# Patient Record
Sex: Female | Born: 1970
Health system: Southern US, Community
[De-identification: ages and names within clinical notes are randomized; demographics above are authoritative.]

## PROBLEM LIST (undated history)

## (undated) DIAGNOSIS — I471 Supraventricular tachycardia, unspecified: Secondary | ICD-10-CM

## (undated) DIAGNOSIS — F419 Anxiety disorder, unspecified: Secondary | ICD-10-CM

## (undated) DIAGNOSIS — G479 Sleep disorder, unspecified: Secondary | ICD-10-CM

## (undated) DIAGNOSIS — E781 Pure hyperglyceridemia: Secondary | ICD-10-CM

## (undated) DIAGNOSIS — L409 Psoriasis, unspecified: Secondary | ICD-10-CM

## (undated) DIAGNOSIS — IMO0002 Reserved for concepts with insufficient information to code with codable children: Secondary | ICD-10-CM

## (undated) DIAGNOSIS — N939 Abnormal uterine and vaginal bleeding, unspecified: Secondary | ICD-10-CM

## (undated) HISTORY — DX: Supraventricular tachycardia, unspecified: I47.10

## (undated) HISTORY — DX: Reserved for concepts with insufficient information to code with codable children: IMO0002

## (undated) HISTORY — PX: CARDIAC ELECTROPHYSIOLOGY STUDY AND ABLATION: SHX1294

## (undated) HISTORY — DX: Supraventricular tachycardia: I47.1

## (undated) HISTORY — DX: Abnormal uterine and vaginal bleeding, unspecified: N93.9

## (undated) HISTORY — DX: Pure hyperglyceridemia: E78.1

## (undated) HISTORY — DX: Psoriasis, unspecified: L40.9

## (undated) HISTORY — DX: Anxiety disorder, unspecified: F41.9

## (undated) HISTORY — DX: Sleep disorder, unspecified: G47.9

---

## 1996-12-15 HISTORY — PX: BREAST ENHANCEMENT SURGERY: SHX7

## 2006-10-20 ENCOUNTER — Ambulatory Visit: Payer: Self-pay | Admitting: Family Medicine

## 2006-12-29 ENCOUNTER — Ambulatory Visit: Payer: Self-pay | Admitting: Family Medicine

## 2006-12-30 ENCOUNTER — Encounter: Payer: Self-pay | Admitting: Family Medicine

## 2008-01-10 ENCOUNTER — Ambulatory Visit: Payer: Self-pay | Admitting: Gynecology

## 2008-01-10 ENCOUNTER — Encounter (INDEPENDENT_AMBULATORY_CARE_PROVIDER_SITE_OTHER): Payer: Self-pay | Admitting: Gynecology

## 2009-01-29 ENCOUNTER — Ambulatory Visit: Payer: Self-pay | Admitting: Obstetrics & Gynecology

## 2009-01-29 ENCOUNTER — Encounter: Payer: Self-pay | Admitting: Obstetrics & Gynecology

## 2009-01-29 DIAGNOSIS — R87619 Unspecified abnormal cytological findings in specimens from cervix uteri: Secondary | ICD-10-CM

## 2009-01-29 DIAGNOSIS — IMO0002 Reserved for concepts with insufficient information to code with codable children: Secondary | ICD-10-CM

## 2009-01-29 HISTORY — DX: Reserved for concepts with insufficient information to code with codable children: IMO0002

## 2009-01-29 HISTORY — DX: Unspecified abnormal cytological findings in specimens from cervix uteri: R87.619

## 2009-03-06 ENCOUNTER — Ambulatory Visit: Payer: Self-pay | Admitting: Obstetrics & Gynecology

## 2009-03-06 HISTORY — PX: ENDOMETRIAL BIOPSY: SHX622

## 2009-08-28 ENCOUNTER — Ambulatory Visit: Payer: Self-pay | Admitting: Family Medicine

## 2009-08-28 ENCOUNTER — Encounter: Payer: Self-pay | Admitting: Family Medicine

## 2010-11-16 ENCOUNTER — Ambulatory Visit: Payer: Self-pay | Admitting: Internal Medicine

## 2010-12-04 ENCOUNTER — Ambulatory Visit: Payer: Self-pay | Admitting: Obstetrics & Gynecology

## 2011-04-29 NOTE — Assessment & Plan Note (Signed)
NAMEBRECK, Sarah Holmes                ACCOUNT NO.:  [id number]   MEDICAL RECORD NO.:  [id number]          PATIENT TYPE:  POB   LOCATION:  [facility] at [facility]         FACILITY:  [facility]   PHYSICIAN:  Scheryl Darter, MD       DATE OF BIRTH:  08-Sep-1971   DATE OF SERVICE:                                  CLINIC NOTE   The patient comes today for yearly exam.  The patient is a 40 year old  white female, gravida 0, last menstrual period began November 24, 2010.  She had been amenorrheic on Depo-Provera and she missed her injection in  November and after that she began having bleeding, which is just now  subsiding.  She wished to continue with Depo-Provera.   PAST MEDICAL HISTORY:  Psoriasis.   PAST SURGICAL HISTORY:  Breast augmentation in 1998.   MEDICATIONS:  Enbrel 50 mg subcu two times a week, Ambien 10 mg p.r.n.  sleep, Zantac OTC p.r.n., Tylenol p.r.n., ibuprofen p.r.n., and Depo-  Provera 150 mg every 3 months.   ALLERGIES:  No known drug allergies.   SOCIAL HISTORY:  The patient is married.  She is a smoker and has tried  to quit in the past.  Wellbutrin and Chantix and patches did not help.  No alcohol or illicit drug use.   FAMILY HISTORY:  Pancreatic and ovarian cancer.  She had a great aunt  with breast cancer.   REVIEW OF SYSTEMS:  Negative except for slight brownish spotting.  No  complaints of pain.  Her psoriasis has resolved on Enbrel.   PHYSICAL EXAMINATION:  GENERAL:  The patient is no acute distress.  Affect is normal.  VITAL SIGNS:  Weight is 143 pounds, which is down from her previous  visit.  Blood pressure 110/69, height 5 feet 5-1/2 inches, pulse 88.  CHEST:  Clear.  HEART:  Regular rhythm.  NECK:  No thyromegaly.  BREASTS:  Exhibit bilateral implants which are symmetric.  No masses or  tenderness or adenopathy.  ABDOMEN:  Flat, soft, nontender.  No mass.  EXTREMITIES:  No swelling or deformities.  PELVIC:  External genitalia, vagina, and cervix show some  slight  brownish bloody discharge.  Pap was done.  Uterus normal size.  No  adnexal mass or tenderness.   IMPRESSION:  1. Normal exam.  The patient wants to continue on Depo-Provera.  2. The patient needs to begin screening for breast disease.   PLAN:  We will order a screening mammogram.  She can continue with her  Depo-Provera 150 mg IM every 3 months.  I have asked her to consider  changing to Mirena.  She has history of abnormal Pap and she will be  notified of this Pap result.      Scheryl Darter, MD     JA/MEDQ  D:  12/04/2010  T:  12/04/2010  Job:  [id number]

## 2011-04-29 NOTE — Assessment & Plan Note (Signed)
Sarah Holmes, Sarah Holmes                ACCOUNT NO.:  [id number]   MEDICAL RECORD NO.:  [id number]          PATIENT TYPE:  POB   LOCATION:  [facility] at [facility]         FACILITY:  [facility]   PHYSICIAN:  Johnella Moloney, MD        DATE OF BIRTH:  1971-11-14   DATE OF SERVICE:                                  CLINIC NOTE   The patient is a 40 year old female multiparous female who presents for  her annual exam and Pap smear.  The patient denies any gynecologic  problems.  She is on Depo-Provera for contraception and also for history  of abnormal bleeding in the past.  She is sexually active but does not  report any concerns.  She has been amenorrheic since she has been on  Depo-Provera/   PAST MEDICAL HISTORY:  None.   PAST SURGICAL HISTORY:  Bilateral breast augmentation in 1998.   MEDICATIONS:  1. Ambien 10 mg q.h.s. as needed  2. Zantac as needed.  3. Ibuprofen and Tylenol as needed.  4. Depo-Provera 150 mg IM q. 3 months.   ALLERGIES:  No known drug allergies.   SOCIAL HISTORY:  The patient smokes a pack a day and has been smoking  for 8 years.  She has tried different smoking cessation methods  including Wellbutrin, Chantix and nicotine patches, but these have not  helped.  She denies any alcohol or addicting drug use.   FAMILY HISTORY:  Grandmother with pancreatic and ovarian cancer.  No  first-degree relatives have any gynecologic cancer or other concerns.   REVIEW OF SYSTEMS:  Comprehensive 14-point review of system was reviewed  with the patient and was entirely negative.   PHYSICAL EXAMINATION:  VITAL SIGNS:  Blood pressure is 128/87, pulse 89,  weight 161 pounds, height 5 feet 5-1/2 inches.  GENERAL:  In no apparent distress.  HEENT:  Normocephalic, atraumatic.  No thyroid enlargement.  BREASTS:  Bilateral augmentation without distortion, masses, tenderness  on nipple discharge.  No lymphadenopathy.  CHEST:  Clear to auscultation bilaterally.  HEART:  Regular rate and  rhythm.  ABDOMEN:  Soft, nontender and nondistended.  No abnormal masses or  organomegaly.  She does have an umbilical piercing.  PELVIC:  Normal external female genitalia.  Pink, well rugated vagina.  No lesions.  Multiparous cervix noted.  Pap smear obtained.  On bimanual  exam, her uterus is small, normal size, shape and contour without  tenderness.  Normal adnexa bilaterally.  EXTREMITIES:  No cyanosis,  clubbing or edema and nontender.   ASSESSMENT/PLAN:  The patient is a 40 year old here for her annual exam.  She had a normal breast examination, and a Pap smear was done today.  The patient was told to continue trying to see if she can sign up with a  smoking cessation program, given that her pharmacological methods did  not work.  She was told that a support group for smoking cessation could  be beneficial to her, and she says that she will follow up with these  programs that were offered at [facility].  She was also  told that smoking cessation programs were also offered at [facility].  She was told to call or come back in for evaluation of any further  gynecologic concerns.           ______________________________  Johnella Moloney, MD     UD/MEDQ  D:  01/29/2009  T:  01/29/2009  Job:  [id number]

## 2011-04-29 NOTE — Assessment & Plan Note (Signed)
NAMEGEARLENE, Sarah Holmes                ACCOUNT NO.:  [id number]   MEDICAL RECORD NO.:  [id number]          PATIENT TYPE:  POB   LOCATION:  [facility]         FACILITY:  [facility]   PHYSICIAN:  Tinnie Gens, MD        DATE OF BIRTH:  [birth year]   DATE OF SERVICE:                                  CLINIC NOTE   CHIEF COMPLAINT:  Repeat Pap.   HISTORY OF PRESENT ILLNESS:  The patient is a 41 year old nulliparas  female who comes in today for repeat Pap.  Her last Pap was in February  2010 and she is CIN 1.  She underwent colposcopy at that time, which  also revealed CIN 1.  She is here for followup Pap.  Today, she is  without complaints.  She continues on Depo-Provera 150 mg IM q.3 months.   PHYSICAL EXAMINATION:  VITAL SIGNS:  Her vitals are as in the chart.  GENERAL:  She is a well-developed, well-nourished female in no acute  stress.  ABDOMEN:  Soft, nontender, nondistended.  GU:  Normal external female genitalia.  BUS normal.  Vagina is pink and  rugated.  Cervix is parous without lesion.  Uterus is small, anteverted.  No adnexal mass or tenderness.   IMPRESSION:  Cervical intraepithelial neoplasia 1.   PLAN:  Repeat Pap today and follow CPE in 1 year.  If the Paps are  normal, continue with yearly Pap.           ______________________________  Tinnie Gens, MD     TP/MEDQ  D:  08/28/2009  T:  08/29/2009  Job:  [id number]

## 2011-05-02 NOTE — Group Therapy Note (Signed)
NAMESAROYA, RICCOBONO                ACCOUNT NO.:  [id number]   MEDICAL RECORD NO.:  [id number]          PATIENT TYPE:  POB   LOCATION:  [facility]                     FACILITY:   PHYSICIAN:  Tinnie Gens, MD        DATE OF BIRTH:  1971/11/25   DATE OF SERVICE:  11/28/2006                                  CLINIC NOTE   CHIEF COMPLAINT:  Yearly exam.   HISTORY OF PRESENT ILLNESS:  [name] is a 40 year old female  multiparous who presents for her annual exam and pap smear.  She states  she was recently married in September and that marriage is going well.  She uses Depo-Provera for birth control and control of history of  abnormal bleeding.  She also smokes one half a pack of cigarettes a day  and does not on stopping.  She states she works double shifts sometimes  at [employer] and has difficulty sleeping after that and would like a  refill on her Ambien.  She denies any other concerns.   ALLERGIES:  No known allergies.   MEDICATIONS:  Ambien 5 mg at bedtime as needed.  It is noted that she  had a prescription last for 30 with 2 refills and she did not call back  for more.   REVIEW OF SYSTEMS:  Patient denies any problem with the head, ears,  eyes, nose, or throat.  BREAST:  Denies masses, distortions or nipple  discharge.  It is noted that she has bilateral implants.  LUNGS:  Denies  coughing, wheezing, congestion.  HEART:  Denies palpation or chest pain.  ABDOMEN:  Denies tenderness, problems with bowels or bladder.  GYNECOLOGIC:  No problems.  Abnormal bleeding is one and she is on Depo.  She denies vaginal discharge or odor.   PHYSICAL EXAMINATION:  The patient is a well-nourished, well-developed  white female.  VITAL SIGNS:  Blood pressure 123/85, pulse 87, her weight today is 160,  height 55 1/2.  HEENT:  Normal cephalic, without thyroid enlargement.  BREAST:  Bilateral augmentation without distortion, masses, tenderness  or nipple discharge bilaterally.  CHEST:  Clear  to AMP.  HEART:  Rate and rhythm were regular without murmur, gallop or cardiac  enlargement.  ABDOMEN:  Soft and supple without masses or organomegaly.  She does have  her belly button pierced.  PELVIC:  Within normal limits for female.  VAGINA:  Clean and rugated.  CERVIX:  Multiparous and clean.  UTERUS:  Normal size, shape and contour without tenderness.  Adnexa  bilaterally clear.   IMPRESSION:  1. Normal GYN exam.  2. Sleep difficulties after long shifts.   PLAN:  1. The patient was given information on the Merina IUD as an option      for birth control and control of her abnormal bleeding.  2. Normal GYN exam.  3. Ambien 5 mg 1 p.o. at bedtime p.r.n. number 30 with 2 refills.  4. She is to return to the clinic for fasting labs to follow up on      elevated triglycerides one year ago.     ______________________________  Matt Holmes, N.P.    ______________________________  Tinnie Gens, MD    EMK/MEDQ  D:  12/29/2006  T:  12/30/2006  Job:  [id number]

## 2012-03-10 ENCOUNTER — Ambulatory Visit (INDEPENDENT_AMBULATORY_CARE_PROVIDER_SITE_OTHER): Payer: 59 | Admitting: Obstetrics & Gynecology

## 2012-03-10 ENCOUNTER — Encounter: Payer: Self-pay | Admitting: Obstetrics & Gynecology

## 2012-03-10 VITALS — BP 135/88 | HR 85 | Ht 65.0 in | Wt 163.0 lb

## 2012-03-10 DIAGNOSIS — F17200 Nicotine dependence, unspecified, uncomplicated: Secondary | ICD-10-CM

## 2012-03-10 DIAGNOSIS — Z3049 Encounter for surveillance of other contraceptives: Secondary | ICD-10-CM

## 2012-03-10 DIAGNOSIS — Z01419 Encounter for gynecological examination (general) (routine) without abnormal findings: Secondary | ICD-10-CM

## 2012-03-10 DIAGNOSIS — F17210 Nicotine dependence, cigarettes, uncomplicated: Secondary | ICD-10-CM | POA: Insufficient documentation

## 2012-03-10 DIAGNOSIS — Z1231 Encounter for screening mammogram for malignant neoplasm of breast: Secondary | ICD-10-CM

## 2012-03-10 DIAGNOSIS — Z1239 Encounter for other screening for malignant neoplasm of breast: Secondary | ICD-10-CM

## 2012-03-10 DIAGNOSIS — Z01812 Encounter for preprocedural laboratory examination: Secondary | ICD-10-CM

## 2012-03-10 DIAGNOSIS — Z124 Encounter for screening for malignant neoplasm of cervix: Secondary | ICD-10-CM

## 2012-03-10 DIAGNOSIS — Z3042 Encounter for surveillance of injectable contraceptive: Secondary | ICD-10-CM

## 2012-03-10 LAB — POCT URINE PREGNANCY: Preg Test, Ur: NEGATIVE

## 2012-03-10 MED ORDER — MEDROXYPROGESTERONE ACETATE 150 MG/ML IM SUSP: 150.0000 mg | Freq: Once | INTRAMUSCULAR | Status: DC

## 2012-03-10 MED ORDER — MEDROXYPROGESTERONE ACETATE 150 MG/ML IM SUSP
150.0000 mg | INTRAMUSCULAR | Status: DC
  Administered 2012-03-10: 150 mg via INTRAMUSCULAR

## 2012-03-10 NOTE — Progress Notes (Signed)
Addended by: Jaynie Collins A on: 03/10/2012 09:48 AM   Modules accepted: Orders

## 2012-03-10 NOTE — Progress Notes (Signed)
Addended by: Vinnie Langton C on: 03/10/2012 09:53 AM   Modules accepted: Orders

## 2012-03-10 NOTE — Progress Notes (Signed)
Addended by: Barbara Cower on: 03/10/2012 09:59 AM   Modules accepted: Orders

## 2012-03-10 NOTE — Progress Notes (Signed)
Addended by: Vinnie Langton C on: 03/10/2012 09:48 AM   Modules accepted: Orders

## 2012-03-10 NOTE — Patient Instructions (Signed)
Preventive Care for Adults, Female A healthy lifestyle and preventive care can promote health and wellness. Preventive health guidelines for women include the following key practices.  A routine yearly physical is a good way to check with your caregiver about your health and preventive screening. It is a chance to share any concerns and updates on your health, and to receive a thorough exam.   Visit your dentist for a routine exam and preventive care every 6 months. Brush your teeth twice a day and floss once a day. Good oral hygiene prevents tooth decay and gum disease.   The frequency of eye exams is based on your age, health, family medical history, use of contact lenses, and other factors. Follow your caregiver's recommendations for frequency of eye exams.   Eat a healthy diet. Foods like vegetables, fruits, whole grains, low-fat dairy products, and lean protein foods contain the nutrients you need without too many calories. Decrease your intake of foods high in solid fats, added sugars, and salt. Eat the right amount of calories for you.Get information about a proper diet from your caregiver, if necessary.   Regular physical exercise is one of the most important things you can do for your health. Most adults should get at least 150 minutes of moderate-intensity exercise (any activity that increases your heart rate and causes you to sweat) each week. In addition, most adults need muscle-strengthening exercises on 2 or more days a week.   Maintain a healthy weight. The body mass index (BMI) is a screening tool to identify possible weight problems. It provides an estimate of body fat based on height and weight. Your caregiver can help determine your BMI, and can help you achieve or maintain a healthy weight.For adults 20 years and older:   A BMI below 18.5 is considered underweight.   A BMI of 18.5 to 24.9 is normal.   A BMI of 25 to 29.9 is considered overweight.   A BMI of 30 and above is  considered obese.   Maintain normal blood lipids and cholesterol levels by exercising and minimizing your intake of saturated fat. Eat a balanced diet with plenty of fruit and vegetables. Blood tests for lipids and cholesterol should begin at age 20 and be repeated every 5 years. If your lipid or cholesterol levels are high, you are over 50, or you are at high risk for heart disease, you may need your cholesterol levels checked more frequently.Ongoing high lipid and cholesterol levels should be treated with medicines if diet and exercise are not effective.   If you smoke, find out from your caregiver how to quit. If you do not use tobacco, do not start.   If you are pregnant, do not drink alcohol. If you are breastfeeding, be very cautious about drinking alcohol. If you are not pregnant and choose to drink alcohol, do not exceed 1 drink per day. One drink is considered to be 12 ounces (355 mL) of beer, 5 ounces (148 mL) of wine, or 1.5 ounces (44 mL) of liquor.   Avoid use of street drugs. Do not share needles with anyone. Ask for help if you need support or instructions about stopping the use of drugs.   High blood pressure causes heart disease and increases the risk of stroke. Your blood pressure should be checked at least every 1 to 2 years. Ongoing high blood pressure should be treated with medicines if weight loss and exercise are not effective.   If you are 55 to 42   years old, ask your caregiver if you should take aspirin to prevent strokes.   Diabetes screening involves taking a blood sample to check your fasting blood sugar level. This should be done once every 3 years, after age 45, if you are within normal weight and without risk factors for diabetes. Testing should be considered at a younger age or be carried out more frequently if you are overweight and have at least 1 risk factor for diabetes.   Breast cancer screening is essential preventive care for women. You should practice "breast  self-awareness." This means understanding the normal appearance and feel of your breasts and may include breast self-examination. Any changes detected, no matter how small, should be reported to a caregiver. Women in their 20s and 30s should have a clinical breast exam (CBE) by a caregiver as part of a regular health exam every 1 to 3 years. After age 40, women should have a CBE every year. Starting at age 40, women should consider having a mammography (breast X-ray test) every year. Women who have a family history of breast cancer should talk to their caregiver about genetic screening. Women at a high risk of breast cancer should talk to their caregivers about having magnetic resonance imaging (MRI) and a mammography every year.   The Pap test is a screening test for cervical cancer. A Pap test can show cell changes on the cervix that might become cervical cancer if left untreated. A Pap test is a procedure in which cells are obtained and examined from the lower end of the uterus (cervix).   Women should have a Pap test starting at age 21.   Between ages 21 and 29, Pap tests should be repeated every 2 years.   Beginning at age 30, you should have a Pap test every 3 years as long as the past 3 Pap tests have been normal.   Some women have medical problems that increase the chance of getting cervical cancer. Talk to your caregiver about these problems. It is especially important to talk to your caregiver if a new problem develops soon after your last Pap test. In these cases, your caregiver may recommend more frequent screening and Pap tests.   The above recommendations are the same for women who have or have not gotten the vaccine for human papillomavirus (HPV).   If you had a hysterectomy for a problem that was not cancer or a condition that could lead to cancer, then you no longer need Pap tests. Even if you no longer need a Pap test, a regular exam is a good idea to make sure no other problems are  starting.   If you are between ages 65 and 70, and you have had normal Pap tests going back 10 years, you no longer need Pap tests. Even if you no longer need a Pap test, a regular exam is a good idea to make sure no other problems are starting.   If you have had past treatment for cervical cancer or a condition that could lead to cancer, you need Pap tests and screening for cancer for at least 20 years after your treatment.   If Pap tests have been discontinued, risk factors (such as a new sexual partner) need to be reassessed to determine if screening should be resumed.   The HPV test is an additional test that may be used for cervical cancer screening. The HPV test looks for the virus that can cause the cell changes on the cervix.   The cells collected during the Pap test can be tested for HPV. The HPV test could be used to screen women aged 30 years and older, and should be used in women of any age who have unclear Pap test results. After the age of 30, women should have HPV testing at the same frequency as a Pap test.   Colorectal cancer can be detected and often prevented. Most routine colorectal cancer screening begins at the age of 50 and continues through age 75. However, your caregiver may recommend screening at an earlier age if you have risk factors for colon cancer. On a yearly basis, your caregiver may provide home test kits to check for hidden blood in the stool. Use of a small camera at the end of a tube, to directly examine the colon (sigmoidoscopy or colonoscopy), can detect the earliest forms of colorectal cancer. Talk to your caregiver about this at age 50, when routine screening begins. Direct examination of the colon should be repeated every 5 to 10 years through age 75, unless early forms of pre-cancerous polyps or small growths are found.   Hepatitis C blood testing is recommended for all people born from 1945 through 1965 and any individual with known risks for hepatitis C.    Practice safe sex. Use condoms and avoid high-risk sexual practices to reduce the spread of sexually transmitted infections (STIs). STIs include gonorrhea, chlamydia, syphilis, trichomonas, herpes, HPV, and human immunodeficiency virus (HIV). Herpes, HIV, and HPV are viral illnesses that have no cure. They can result in disability, cancer, and death. Sexually active women aged 25 and younger should be checked for chlamydia. Older women with new or multiple partners should also be tested for chlamydia. Testing for other STIs is recommended if you are sexually active and at increased risk.   Osteoporosis is a disease in which the bones lose minerals and strength with aging. This can result in serious bone fractures. The risk of osteoporosis can be identified using a bone density scan. Women ages 65 and over and women at risk for fractures or osteoporosis should discuss screening with their caregivers. Ask your caregiver whether you should take a calcium supplement or vitamin D to reduce the rate of osteoporosis.   Menopause can be associated with physical symptoms and risks. Hormone replacement therapy is available to decrease symptoms and risks. You should talk to your caregiver about whether hormone replacement therapy is right for you.   Use sunscreen with sun protection factor (SPF) of 30 or more. Apply sunscreen liberally and repeatedly throughout the day. You should seek shade when your shadow is shorter than you. Protect yourself by wearing long sleeves, pants, a wide-brimmed hat, and sunglasses year round, whenever you are outdoors.   Once a month, do a whole body skin exam, using a mirror to look at the skin on your back. Notify your caregiver of new moles, moles that have irregular borders, moles that are larger than a pencil eraser, or moles that have changed in shape or color.   Stay current with required immunizations.   Influenza. You need a dose every fall (or winter). The composition of  the flu vaccine changes each year, so being vaccinated once is not enough.   Pneumococcal polysaccharide. You need 1 to 2 doses if you smoke cigarettes or if you have certain chronic medical conditions. You need 1 dose at age 65 (or older) if you have never been vaccinated.   Tetanus, diphtheria, pertussis (Tdap, Td). Get 1 dose of   Tdap vaccine if you are younger than age 65, are over 65 and have contact with an infant, are a healthcare worker, are pregnant, or simply want to be protected from whooping cough. After that, you need a Td booster dose every 10 years. Consult your caregiver if you have not had at least 3 tetanus and diphtheria-containing shots sometime in your life or have a deep or dirty wound.   HPV. You need this vaccine if you are a woman age 26 or younger. The vaccine is given in 3 doses over 6 months.   Measles, mumps, rubella (MMR). You need at least 1 dose of MMR if you were born in 1957 or later. You may also need a second dose.   Meningococcal. If you are age 19 to 21 and a first-year college student living in a residence hall, or have one of several medical conditions, you need to get vaccinated against meningococcal disease. You may also need additional booster doses.   Zoster (shingles). If you are age 60 or older, you should get this vaccine.   Varicella (chickenpox). If you have never had chickenpox or you were vaccinated but received only 1 dose, talk to your caregiver to find out if you need this vaccine.   Hepatitis A. You need this vaccine if you have a specific risk factor for hepatitis A virus infection or you simply wish to be protected from this disease. The vaccine is usually given as 2 doses, 6 to 18 months apart.   Hepatitis B. You need this vaccine if you have a specific risk factor for hepatitis B virus infection or you simply wish to be protected from this disease. The vaccine is given in 3 doses, usually over 6 months.  Preventive Services /  Frequency Ages 19 to 39  Blood pressure check.** / Every 1 to 2 years.   Lipid and cholesterol check.** / Every 5 years beginning at age 20.   Clinical breast exam.** / Every 3 years for women in their 20s and 30s.   Pap test.** / Every 2 years from ages 21 through 29. Every 3 years starting at age 30 through age 65 or 70 with a history of 3 consecutive normal Pap tests.   HPV screening.** / Every 3 years from ages 30 through ages 65 to 70 with a history of 3 consecutive normal Pap tests.   Hepatitis C blood test.** / For any individual with known risks for hepatitis C.   Skin self-exam. / Monthly.   Influenza immunization.** / Every year.   Pneumococcal polysaccharide immunization.** / 1 to 2 doses if you smoke cigarettes or if you have certain chronic medical conditions.   Tetanus, diphtheria, pertussis (Tdap, Td) immunization. / A one-time dose of Tdap vaccine. After that, you need a Td booster dose every 10 years.   HPV immunization. / 3 doses over 6 months, if you are 26 and younger.   Measles, mumps, rubella (MMR) immunization. / You need at least 1 dose of MMR if you were born in 1957 or later. You may also need a second dose.   Meningococcal immunization. / 1 dose if you are age 19 to 21 and a first-year college student living in a residence hall, or have one of several medical conditions, you need to get vaccinated against meningococcal disease. You may also need additional booster doses.   Varicella immunization.** / Consult your caregiver.   Hepatitis A immunization.** / Consult your caregiver. 2 doses, 6 to 18 months   apart.   Hepatitis B immunization.** / Consult your caregiver. 3 doses usually over 6 months.  Ages 40 to 64  Blood pressure check.** / Every 1 to 2 years.   Lipid and cholesterol check.** / Every 5 years beginning at age 20.   Clinical breast exam.** / Every year after age 40.   Mammogram.** / Every year beginning at age 40 and continuing for as  long as you are in good health. Consult with your caregiver.   Pap test.** / Every 3 years starting at age 30 through age 65 or 70 with a history of 3 consecutive normal Pap tests.   HPV screening.** / Every 3 years from ages 30 through ages 65 to 70 with a history of 3 consecutive normal Pap tests.   Fecal occult blood test (FOBT) of stool. / Every year beginning at age 50 and continuing until age 75. You may not need to do this test if you get a colonoscopy every 10 years.   Flexible sigmoidoscopy or colonoscopy.** / Every 5 years for a flexible sigmoidoscopy or every 10 years for a colonoscopy beginning at age 50 and continuing until age 75.   Hepatitis C blood test.** / For all people born from 1945 through 1965 and any individual with known risks for hepatitis C.   Skin self-exam. / Monthly.   Influenza immunization.** / Every year.   Pneumococcal polysaccharide immunization.** / 1 to 2 doses if you smoke cigarettes or if you have certain chronic medical conditions.   Tetanus, diphtheria, pertussis (Tdap, Td) immunization.** / A one-time dose of Tdap vaccine. After that, you need a Td booster dose every 10 years.   Measles, mumps, rubella (MMR) immunization. / You need at least 1 dose of MMR if you were born in 1957 or later. You may also need a second dose.   Varicella immunization.** / Consult your caregiver.   Meningococcal immunization.** / Consult your caregiver.   Hepatitis A immunization.** / Consult your caregiver. 2 doses, 6 to 18 months apart.   Hepatitis B immunization.** / Consult your caregiver. 3 doses, usually over 6 months.  Ages 65 and over  Blood pressure check.** / Every 1 to 2 years.   Lipid and cholesterol check.** / Every 5 years beginning at age 20.   Clinical breast exam.** / Every year after age 40.   Mammogram.** / Every year beginning at age 40 and continuing for as long as you are in good health. Consult with your caregiver.   Pap test.** /  Every 3 years starting at age 30 through age 65 or 70 with a 3 consecutive normal Pap tests. Testing can be stopped between 65 and 70 with 3 consecutive normal Pap tests and no abnormal Pap or HPV tests in the past 10 years.   HPV screening.** / Every 3 years from ages 30 through ages 65 or 70 with a history of 3 consecutive normal Pap tests. Testing can be stopped between 65 and 70 with 3 consecutive normal Pap tests and no abnormal Pap or HPV tests in the past 10 years.   Fecal occult blood test (FOBT) of stool. / Every year beginning at age 50 and continuing until age 75. You may not need to do this test if you get a colonoscopy every 10 years.   Flexible sigmoidoscopy or colonoscopy.** / Every 5 years for a flexible sigmoidoscopy or every 10 years for a colonoscopy beginning at age 50 and continuing until age 75.   Hepatitis   C blood test.** / For all people born from 1945 through 1965 and any individual with known risks for hepatitis C.   Osteoporosis screening.** / A one-time screening for women ages 65 and over and women at risk for fractures or osteoporosis.   Skin self-exam. / Monthly.   Influenza immunization.** / Every year.   Pneumococcal polysaccharide immunization.** / 1 dose at age 65 (or older) if you have never been vaccinated.   Tetanus, diphtheria, pertussis (Tdap, Td) immunization. / A one-time dose of Tdap vaccine if you are over 65 and have contact with an infant, are a healthcare worker, or simply want to be protected from whooping cough. After that, you need a Td booster dose every 10 years.   Varicella immunization.** / Consult your caregiver.   Meningococcal immunization.** / Consult your caregiver.   Hepatitis A immunization.** / Consult your caregiver. 2 doses, 6 to 18 months apart.   Hepatitis B immunization.** / Check with your caregiver. 3 doses, usually over 6 months.  ** Family history and personal history of risk and conditions may change your caregiver's  recommendations. Document Released: 01/27/2002 Document Revised: 11/20/2011 Document Reviewed: 04/28/2011 [employer] Patient Information 2012 [employer]. 

## 2012-03-10 NOTE — Progress Notes (Addendum)
  Subjective:     Sarah Holmes is a 41 y.o. G0 female and is here for a comprehensive physical exam. The patient reports no problems. Trying to quit smoking.  History   Social History  . Marital Status: Married    Spouse Name: N/A    Number of Children: N/A  . Years of Education: N/A   Occupational History  . Not on file.   Social History Main Topics  . Smoking status: Current Everyday Smoker -- 0.5 packs/day  . Smokeless tobacco: Not on file  . Alcohol Use: Yes     rare  . Drug Use: No  . Sexually Active: Yes -- Female partner(s)    Birth Control/ Protection: None   Other Topics Concern  . Not on file   Social History Narrative  . No narrative on file   Health Maintenance  Topic Date Due  . Pap Smear  11/06/1988  . Tetanus/tdap  11/06/1989  . Influenza Vaccine  09/15/2011    The following portions of the patient's history were reviewed and updated as appropriate: allergies, current medications, past family history, past medical history, past social history, past surgical history and problem list.  Review of Systems A comprehensive review of systems was negative.   Objective:    Blood pressure 135/88, pulse 85, height 5\' 5"  (1.651 m), weight 163 lb (73.936 kg), last menstrual period 02/17/2012. GENERAL: Well-developed, well-nourished female in no acute distress.  HEENT: Normocephalic, atraumatic. Sclerae anicteric.  NECK: Supple. Normal thyroid.  LUNGS: Clear to auscultation bilaterally.  HEART: Regular rate and rhythm. BREASTS: Symmetric with everted nipples. Implants in place bilaterally. No abnormal masses, skin changes, nipple drainage, or lymphadenopathy. ABDOMEN: Soft, nontender, nondistended. No organomegaly. PELVIC: Normal external female genitalia. Vagina is pink and rugated.  Normal discharge. Normal nulliparous cervix contour. Pap smear obtained. Uterus is normal in size. No adnexal mass or tenderness.  EXTREMITIES: No cyanosis, clubbing, or edema, 2+  distal pulses.   Assessment:    Healthy female exam.    Plan:   Follow up pap smear Mammogram to be scheduled Patient to get preventative health maintenance labs at [facility] Continue Depo Provera for [facility]; ui Routine GYN care

## 2012-03-30 ENCOUNTER — Ambulatory Visit: Payer: 59

## 2012-04-13 ENCOUNTER — Ambulatory Visit
Admission: RE | Admit: 2012-04-13 | Discharge: 2012-04-13 | Disposition: A | Discharge status: Home / Self Care | Payer: 59 | Source: Ambulatory Visit | Attending: Obstetrics & Gynecology | Admitting: Obstetrics & Gynecology

## 2013-04-19 ENCOUNTER — Other Ambulatory Visit: Payer: Self-pay | Admitting: Obstetrics & Gynecology

## 2014-02-08 ENCOUNTER — Emergency Department: Payer: Self-pay | Admitting: Emergency Medicine

## 2014-02-08 LAB — BASIC METABOLIC PANEL
ANION GAP: 9 (ref 7–16)
BUN: 2 mg/dL — ABNORMAL LOW (ref 7–18)
CO2: 24 mmol/L (ref 21–32)
Calcium, Total: 8.7 mg/dL (ref 8.5–10.1)
Chloride: 104 mmol/L (ref 98–107)
Creatinine: 0.63 mg/dL (ref 0.60–1.30)
GLUCOSE: 143 mg/dL — AB (ref 65–99)
OSMOLALITY: 272 (ref 275–301)
Potassium: 2.8 mmol/L — ABNORMAL LOW (ref 3.5–5.1)
SODIUM: 137 mmol/L (ref 136–145)

## 2014-02-08 LAB — CBC
HCT: 45.3 % (ref 35.0–47.0)
HGB: 15.7 g/dL (ref 12.0–16.0)
MCH: 39.5 pg — AB (ref 26.0–34.0)
MCHC: 34.6 g/dL (ref 32.0–36.0)
MCV: 114 fL — AB (ref 80–100)
Platelet: 295 10*3/uL (ref 150–440)
RBC: 3.97 10*6/uL (ref 3.80–5.20)
RDW: 16 % — ABNORMAL HIGH (ref 11.5–14.5)
WBC: 15 10*3/uL — ABNORMAL HIGH (ref 3.6–11.0)

## 2014-02-08 LAB — TROPONIN I: Troponin-I: <0.02 ng/mL

## 2014-05-08 ENCOUNTER — Emergency Department: Payer: Self-pay | Admitting: Emergency Medicine

## 2014-05-08 LAB — CBC
HCT: 44.5 % (ref 35.0–47.0)
HGB: 15.5 g/dL (ref 12.0–16.0)
MCH: 40 pg — AB (ref 26.0–34.0)
MCHC: 34.8 g/dL (ref 32.0–36.0)
MCV: 115 fL — AB (ref 80–100)
Platelet: 288 10*3/uL (ref 150–440)
RBC: 3.88 10*6/uL (ref 3.80–5.20)
RDW: 15.3 % — ABNORMAL HIGH (ref 11.5–14.5)
WBC: 15 10*3/uL — ABNORMAL HIGH (ref 3.6–11.0)

## 2014-05-08 LAB — URINALYSIS, COMPLETE
BLOOD: NEGATIVE
Bacteria: NONE SEEN
Bilirubin,UR: NEGATIVE
GLUCOSE, UR: NEGATIVE mg/dL (ref 0–75)
KETONE: NEGATIVE
Leukocyte Esterase: NEGATIVE
Nitrite: NEGATIVE
Ph: 7 (ref 4.5–8.0)
Protein: NEGATIVE
Specific Gravity: 1.004 (ref 1.003–1.030)
Squamous Epithelial: 1

## 2014-05-08 LAB — COMPREHENSIVE METABOLIC PANEL
ALK PHOS: 147 U/L — AB
ANION GAP: 10 (ref 7–16)
AST: 16 U/L (ref 15–37)
Albumin: 4 g/dL (ref 3.4–5.0)
BUN: 6 mg/dL — AB (ref 7–18)
Bilirubin,Total: 0.7 mg/dL (ref 0.2–1.0)
CO2: 26 mmol/L (ref 21–32)
CREATININE: 0.54 mg/dL — AB (ref 0.60–1.30)
Calcium, Total: 8.9 mg/dL (ref 8.5–10.1)
Chloride: 105 mmol/L (ref 98–107)
EGFR (Non-African Amer.): >60
GLUCOSE: 105 mg/dL — AB (ref 65–99)
Osmolality: 279 (ref 275–301)
Potassium: 2.5 mmol/L — CL (ref 3.5–5.1)
SGPT (ALT): 16 U/L (ref 12–78)
Sodium: 141 mmol/L (ref 136–145)
TOTAL PROTEIN: 7.7 g/dL (ref 6.4–8.2)

## 2014-05-08 LAB — TROPONIN I: Troponin-I: <0.02 ng/mL

## 2014-05-08 LAB — T4, FREE: Free Thyroxine: 1.1 ng/dL (ref 0.76–1.46)

## 2014-05-08 LAB — MAGNESIUM: Magnesium: 1.7 mg/dL — ABNORMAL LOW

## 2014-05-08 LAB — TSH: Thyroid Stimulating Horm: 0.44 u[IU]/mL — ABNORMAL LOW

## 2015-09-03 DIAGNOSIS — F411 Generalized anxiety disorder: Secondary | ICD-10-CM | POA: Insufficient documentation

## 2015-09-17 ENCOUNTER — Encounter: Payer: Self-pay | Admitting: Internal Medicine

## 2015-09-17 ENCOUNTER — Ambulatory Visit (INDEPENDENT_AMBULATORY_CARE_PROVIDER_SITE_OTHER): Payer: BLUE CROSS/BLUE SHIELD | Admitting: Internal Medicine

## 2015-09-17 VITALS — BP 122/86 | HR 78 | Ht 65.5 in | Wt 163.6 lb

## 2015-09-17 DIAGNOSIS — I471 Supraventricular tachycardia: Secondary | ICD-10-CM | POA: Diagnosis not present

## 2015-09-17 DIAGNOSIS — Z79899 Other long term (current) drug therapy: Secondary | ICD-10-CM | POA: Diagnosis not present

## 2015-09-17 DIAGNOSIS — R55 Syncope and collapse: Secondary | ICD-10-CM

## 2015-09-17 LAB — BASIC METABOLIC PANEL
BUN: 10 mg/dL (ref 7–25)
CHLORIDE: 105 mmol/L (ref 98–110)
CO2: 22 mmol/L (ref 20–31)
Calcium: 9.4 mg/dL (ref 8.6–10.2)
Creat: 0.66 mg/dL (ref 0.50–1.10)
GLUCOSE: 96 mg/dL (ref 65–99)
POTASSIUM: 3.5 mmol/L (ref 3.5–5.3)
Sodium: 138 mmol/L (ref 135–146)

## 2015-09-17 LAB — MAGNESIUM: Magnesium: 1.8 mg/dL (ref 1.5–2.5)

## 2015-09-17 MED ORDER — DILTIAZEM HCL ER COATED BEADS 120 MG PO CP24: 120.0000 mg | ORAL_CAPSULE | Freq: Every day | ORAL | Status: DC

## 2015-09-17 NOTE — Progress Notes (Signed)
ELECTROPHYSIOLOGY CONSULT NOTE  Patient ID: Sarah Holmes, MRN: [id number], DOB/AGE: 1971/03/21 44 y.o. Admit date: (Not on file) Date of Consult: 09/17/2015  Primary Physician: Kirk Ruths., MD Primary Cardiologist: new  Chief Complaint: SVT      HPI Sarah Holmes is a 44 y.o. female  Seen for SVT. This occurred initially about 20 half years ago. Her heart rate was apparently documented at 2 30 bpm. She received adenosine at the [facility] ER. She was treated with metoprolol. She had recurrences prompting up titration which she tolerated poorly. He is well complicated by dizziness. She subsequently is up on digoxin. Notably she has a history of significant hypokalemia and has been on potassium repletion in the past.  Her episodes of tachycardia are frog negative. They're associated with lightheadedness as noted and some shortness of breath.  Apart from the above she has had no problems with exercise intolerance.  Cardiac evaluation was reported to include an echo that was apparently normal.      Past Medical History  Diagnosis Date  . Psoriasis   . Abnormal Pap smear 08/28/09    lgsil  . Abnormal Pap smear 01/29/09    lgsil  . Abnormal uterine bleeding     CONTROLLED W/DEPO PROVERA  . Sleep difficulties   . High triglycerides   . SVT (supraventricular tachycardia) ([place])   . Anxiety       Surgical History:  Past Surgical History  Procedure Laterality Date  . Breast enhancement surgery  1998    Dr. Wendy Poet  . Endometrial biopsy  03/06/09    low grade squamous CIN1     Home Meds: Prior to Admission medications   Medication Sig Start Date End Date Taking? Authorizing Provider  cyanocobalamin (,VITAMIN B-12,) 1000 MCG/ML injection Inject 1 mcg into the muscle every 30 (thirty) days. 09/03/15  Yes Historical Provider, MD  digoxin (LANOXIN) 0.125 MG tablet Take 1 tablet by mouth daily. 09/03/15 09/02/16 Yes Historical Provider, MD  DULoxetine (CYMBALTA) 60 MG  capsule Take 60 mg by mouth daily. 04/09/15  Yes Historical Provider, MD  medroxyPROGESTERone (DEPO-PROVERA) 150 MG/ML injection Inject 150 mg into the muscle every 3 (three) months.   Yes Historical Provider, MD  metoprolol tartrate (LOPRESSOR) 25 MG tablet Take 25 mg by mouth daily.   Yes Historical Provider, MD  ranitidine (ZANTAC) 150 MG tablet Take 150 mg by mouth at bedtime.   Yes Historical Provider, MD  topiramate (TOPAMAX) 25 MG tablet Take 1 tablet by mouth 2 (two) times daily. 09/03/15 09/02/16 Yes Historical Provider, MD    Inpatient Medications:  . medroxyPROGESTERone  150 mg Intramuscular Q90 days    Allergies: No Known Allergies  Social History   Social History  . Marital Status: Married    Spouse Name: N/A  . Number of Children: N/A  . Years of Education: N/A   Occupational History  . Not on file.   Social History Main Topics  . Smoking status: Current Every Day Smoker -- 0.50 packs/day  . Smokeless tobacco: Not on file  . Alcohol Use: Yes     Comment: rare  . Drug Use: No  . Sexual Activity:    Partners: Male    Birth Control/ Protection: None   Other Topics Concern  . Not on file   Social History Narrative     Family History  Problem Relation Age of Onset  . Diabetes Mother   . Hypertension Mother   . Cancer Father 36  LUNG   . CVA Father   . Cancer Maternal Grandmother     PANCREATIC  . Cancer Maternal Grandfather     LUNG  . Ovarian cancer Maternal Grandfather   . Diabetes Paternal Grandmother      ROS:  Please see the history of present illness.     All other systems reviewed and negative.    Physical Exam:   Blood pressure 122/86, pulse 78, height 5' 5.5" (1.664 m), weight 163 lb 9.6 oz (74.208 kg). General: Well developed, well nourished female in no acute distress. Head: Normocephalic, atraumatic, sclera non-icteric, no xanthomas, nares are without discharge. EENT: normal Lymph Nodes:  none Back: without scoliosis/kyphosis*  no  CVA tendersness Neck: Negative for carotid bruits. JVD not elevated. Lungs: Clear bilaterally to auscultation without wheezes, rales, or rhonchi. Breathing is unlabored. Heart: RRR with S1 S2. N  murmur , rubs, or gallops appreciated. Abdomen: Soft, non-tender, non-distended with normoactive bowel sounds. No hepatomegaly. No rebound/guarding. No obvious abdominal masses. Msk:  Strength and tone appear normal for age. Extremities: No clubbing or cyanosis. No  edema.  Distal pedal pulses are 2+ and equal bilaterally. Skin: Warm and Dry Neuro: Alert and oriented X 3. CN III-XII intact Grossly normal sensory and motor function . Psych:  Responds to questions appropriately with a normal affect.      Labs: Cardiac Enzymes No results for input(s): CKTOTAL, CKMB, TROPONINI in the last 72 hours. CBC Lab Results  Component Value Date   WBC 15.0* 05/08/2014   HGB 15.5 05/08/2014   HCT 44.5 05/08/2014   MCV 115* 05/08/2014   PLT 288 05/08/2014   PROTIME: No results for input(s): LABPROT, INR in the last 72 hours. Chemistry No results for input(s): NA, K, CL, CO2, BUN, CREATININE, CALCIUM, PROT, BILITOT, ALKPHOS, ALT, AST, GLUCOSE in the last 168 hours.  Invalid input(s): LABALBU Lipids No results found for: CHOL, HDL, LDLCALC, TRIG BNP No results found for: PROBNP Thyroid Function Tests: No results for input(s): TSH, T4TOTAL, T3FREE, THYROIDAB in the last 72 hours.  Invalid input(s): FREET3    Miscellaneous No results found for: DDIMER  Radiology/Studies:  No results found.  EKG NSR 16/08/39  Assessment and Plan:  SVT  Presyncope  Hypokalemia  ? Anxiety disorder   The patient has documented edematous and responsive SVT. This was about 2-1/2 years ago. She has had repeated episodes of tachypalpitations associated with lightheadedness at their initiation. These are probably the same mechanism but given the association with stress, I will undertake an event recorder to make  sure that she doesn't have another mechanism of tachycardia palpitations which are likely to persist following catheter ablation.  We have discussed catheter ablation procedure will [place] and risks. We will tentatively schedule her to see Dr. Lovena Le in about 4 weeks.  She has a history of hypokalemia. All the blood tests that I can find in epic both here and and at [facility] R less than 3.5 and 2 of them are less than 3.0. This will need to be addressed with supplementation and then an evaluation as to the cause of potassium wasting      Virl Axe

## 2015-09-17 NOTE — Patient Instructions (Addendum)
Medication Instructions:  Your physician has recommended you make the following change in your medication: 1) STOP Metoprolol 2) START Cardizem 120 mg daily  Labwork: Today: BMET,  Magnesium, & Digoxin level  Testing/Procedures: Your physician has recommended that you wear an event monitor. Event monitors are medical devices that record the heart's electrical activity. Doctors most often [place] these monitors to diagnose arrhythmias. Arrhythmias are problems with the speed or rhythm of the heartbeat. The monitor is a small, portable device. You can wear one while you do your normal daily activities. This is usually used to diagnose what is causing palpitations/syncope (passing out).  Follow-Up: Your physician recommends that you schedule a follow-up appointment in: 4 weeks with Dr. Lovena Le to discuss SVT ablation.   Any Other Special Instructions Will Be Listed Below (If Applicable). Thank you for choosing [facility]!!

## 2015-09-18 ENCOUNTER — Ambulatory Visit (INDEPENDENT_AMBULATORY_CARE_PROVIDER_SITE_OTHER): Payer: BLUE CROSS/BLUE SHIELD

## 2015-09-18 DIAGNOSIS — Z79899 Other long term (current) drug therapy: Secondary | ICD-10-CM

## 2015-09-18 DIAGNOSIS — I471 Supraventricular tachycardia: Secondary | ICD-10-CM

## 2015-09-18 DIAGNOSIS — R55 Syncope and collapse: Secondary | ICD-10-CM | POA: Diagnosis not present

## 2015-09-18 LAB — DIGOXIN LEVEL: Digoxin Level: 0.6 ug/L — ABNORMAL LOW (ref 0.8–2.0)

## 2015-09-20 ENCOUNTER — Telehealth: Payer: Self-pay | Admitting: Internal Medicine

## 2015-09-20 NOTE — Telephone Encounter (Signed)
F/u        Pt calling back for lab results.

## 2015-09-20 NOTE — Telephone Encounter (Signed)
Informed the pt that per Dr Caryl Comes her labs were normal.  Pt verbalized understanding.

## 2015-09-26 ENCOUNTER — Telehealth: Payer: Self-pay

## 2015-09-26 NOTE — Telephone Encounter (Signed)
Discussed with Dr Lovena Le and he advised that the pt needs to continue current medications (beta blocker was stopped due to swelling) and offered to have the pt's appointment moved up.  There are consult slots available on 10/10/15.  I left the pt a message to call the office if she would like an earlier appointment.

## 2015-09-26 NOTE — Telephone Encounter (Signed)
Follow up:  Pt is returning your call   Thanks

## 2015-09-26 NOTE — Telephone Encounter (Signed)
[employer] called and the pt went into SVT with a rate of 202 for 47 seconds at 9:56 central time.  The pt complained of chest pressure, fluttering in chest.  [employer] did attempt to reach the pt but could not contact the pt. I attempted to reach the pt and left a message on her voicemail to contact our office. Will have strip pulled.

## 2015-09-26 NOTE — Telephone Encounter (Signed)
I spoke with the pt and she states that her heart rate has slowed down and at this time she feels tired.  I will review strips with Dr Lovena Le and contact the pt with any other recommendations.

## 2015-09-26 NOTE — Telephone Encounter (Signed)
The pt moved up appointment to 10/10/15.

## 2015-10-10 ENCOUNTER — Ambulatory Visit (INDEPENDENT_AMBULATORY_CARE_PROVIDER_SITE_OTHER): Payer: BLUE CROSS/BLUE SHIELD | Admitting: Internal Medicine

## 2015-10-10 ENCOUNTER — Encounter: Payer: Self-pay | Admitting: Internal Medicine

## 2015-10-10 VITALS — BP 124/76 | HR 86 | Ht 65.5 in | Wt 164.0 lb

## 2015-10-10 DIAGNOSIS — I471 Supraventricular tachycardia, unspecified: Secondary | ICD-10-CM

## 2015-10-10 DIAGNOSIS — L409 Psoriasis, unspecified: Secondary | ICD-10-CM | POA: Insufficient documentation

## 2015-10-10 DIAGNOSIS — F411 Generalized anxiety disorder: Secondary | ICD-10-CM

## 2015-10-10 DIAGNOSIS — Z720 Tobacco use: Secondary | ICD-10-CM | POA: Diagnosis not present

## 2015-10-10 NOTE — Progress Notes (Signed)
HPI Sarah Holmes is referred today by Dr. Caryl Comes for evaluation of SVT. She is a pleasant 44 yo woman with a h/o palpitations dating back several years. She has had recurrent documented SVT and required treatment with IV adenosine. She has been on metoprolol for a couple of years and required uptitration of her metoprolol resulting in fatigue. When she experiences her SVT she will have sob, chest pressure, and near syncope. The longest episode lasted for over a hour in duration. After switching to cardizem and digoxin, she has had break through episodes of SVT, up to 190/min. No Known Allergies   Current Outpatient Prescriptions  Medication Sig Dispense Refill  . cyanocobalamin (,VITAMIN B-12,) 1000 MCG/ML injection Inject 1 mcg into the muscle every 30 (thirty) days.    . digoxin (LANOXIN) 0.125 MG tablet Take 1 tablet by mouth daily.    Marland Kitchen diltiazem (CARDIZEM CD) 120 MG 24 hr capsule Take 1 capsule (120 mg total) by mouth daily. 30 capsule 3  . DULoxetine (CYMBALTA) 60 MG capsule Take 60 mg by mouth daily.    . medroxyPROGESTERone (DEPO-PROVERA) 150 MG/ML injection Inject 150 mg into the muscle every 3 (three) months.    . ranitidine (ZANTAC) 150 MG tablet Take 150 mg by mouth at bedtime.    . topiramate (TOPAMAX) 25 MG tablet Take 1 tablet by mouth 2 (two) times daily.     No current facility-administered medications for this visit.     Past Medical History  Diagnosis Date  . Psoriasis   . Abnormal Pap smear 08/28/09    lgsil  . Abnormal Pap smear 01/29/09    lgsil  . Abnormal uterine bleeding     CONTROLLED W/DEPO PROVERA  . Sleep difficulties   . High triglycerides   . SVT (supraventricular tachycardia) ([place])   . Anxiety     ROS:   All systems reviewed and negative except as noted in the HPI.   Past Surgical History  Procedure Laterality Date  . Breast enhancement surgery  1998    Dr. Wendy Poet  . Endometrial biopsy  03/06/09    low grade squamous CIN1     Family  History  Problem Relation Age of Onset  . Diabetes Mother   . Hypertension Mother   . Cancer Father 63    LUNG   . CVA Father   . Cancer Maternal Grandmother     PANCREATIC  . Cancer Maternal Grandfather     LUNG  . Ovarian cancer Maternal Grandfather   . Diabetes Paternal Grandmother      Social History   Social History  . Marital Status: Married    Spouse Name: N/A  . Number of Children: N/A  . Years of Education: N/A   Occupational History  . Not on file.   Social History Main Topics  . Smoking status: Current Every Day Smoker -- 0.50 packs/day  . Smokeless tobacco: Not on file  . Alcohol Use: Yes     Comment: rare  . Drug Use: No  . Sexual Activity:    Partners: Male    Birth Control/ Protection: None   Other Topics Concern  . Not on file   Social History Narrative     BP 124/76 mmHg  Pulse 86  Ht 5' 5.5" (1.664 m)  Wt 164 lb (74.39 kg)  BMI 26.87 kg/m2  Physical Exam:  Well appearing NAD HEENT: Unremarkable Neck:  No JVD, no thyromegally Lymphatics:  No adenopathy Back:  No CVA tenderness Lungs:  Clear HEART:  Regular rate rhythm, no murmurs, no rubs, no clicks Abd:  soft, positive bowel sounds, no organomegally, no rebound, no guarding Ext:  2 plus pulses, no edema, no cyanosis, no clubbing Skin:  No rashes no nodules Neuro:  CN II through XII intact, motor grossly intact  EKG - NSR with no pre-excitation   Assess/Plan:

## 2015-10-10 NOTE — Assessment & Plan Note (Signed)
She will be encouraged to stop smoking.

## 2015-10-10 NOTE — Assessment & Plan Note (Signed)
She is made more anxious by her SVT. Hopefully this will improve after ablation.

## 2015-10-10 NOTE — Assessment & Plan Note (Signed)
The patient has had medically refractory SVT. I have discussed the risks/benefits/goals/expectations of catheter ablation with the patient and she wishes to proceed. We will schedule ASAP.

## 2015-10-10 NOTE — Patient Instructions (Addendum)
Medication Instructions:  Your physician recommends that you continue on your current medications as directed. Please refer to the Current Medication list given to you today.  Labwork: None ordered  Testing/Procedures: Your physician has recommended that you have an SVT ablation. Catheter ablation is a medical procedure used to treat some cardiac arrhythmias (irregular heartbeats). During catheter ablation, a long, thin, flexible tube is put into a blood vessel in your groin (upper thigh), or neck. This tube is called an ablation catheter. It is then guided to your heart through the blood vessel. Radio frequency waves destroy small areas of heart tissue where abnormal heartbeats may cause an arrhythmia to start.   The nurse will call you to arrange this procedure.  Follow-Up: To be determined once procedure is scheduled.  Any Other Special Instructions Will Be Listed Below (If Applicable). - If you need a refill on your cardiac medications before your next appointment, please call your pharmacy.  Thank you for choosing [facility]!!

## 2015-10-11 ENCOUNTER — Telehealth: Payer: Self-pay | Admitting: Internal Medicine

## 2015-10-11 ENCOUNTER — Other Ambulatory Visit (INDEPENDENT_AMBULATORY_CARE_PROVIDER_SITE_OTHER): Payer: BLUE CROSS/BLUE SHIELD | Admitting: *Deleted

## 2015-10-11 DIAGNOSIS — Z01812 Encounter for preprocedural laboratory examination: Secondary | ICD-10-CM | POA: Diagnosis not present

## 2015-10-11 DIAGNOSIS — I471 Supraventricular tachycardia: Secondary | ICD-10-CM

## 2015-10-11 LAB — CBC WITH DIFFERENTIAL/PLATELET
Basophils Absolute: 0 10*3/uL (ref 0.0–0.1)
Basophils Relative: 0 % (ref 0–1)
Eosinophils Absolute: 0.2 10*3/uL (ref 0.0–0.7)
Eosinophils Relative: 2 % (ref 0–5)
HEMATOCRIT: 42.8 % (ref 36.0–46.0)
Hemoglobin: 15.1 g/dL — ABNORMAL HIGH (ref 12.0–15.0)
LYMPHS ABS: 2.4 10*3/uL (ref 0.7–4.0)
LYMPHS PCT: 26 % (ref 12–46)
MCH: 35.1 pg — AB (ref 26.0–34.0)
MCHC: 35.3 g/dL (ref 30.0–36.0)
MCV: 99.5 fL (ref 78.0–100.0)
MONO ABS: 0.6 10*3/uL (ref 0.1–1.0)
MONOS PCT: 6 % (ref 3–12)
MPV: 10.4 fL (ref 8.6–12.4)
Neutro Abs: 6.1 10*3/uL (ref 1.7–7.7)
Neutrophils Relative %: 66 % (ref 43–77)
Platelets: 219 10*3/uL (ref 150–400)
RBC: 4.3 MIL/uL (ref 3.87–5.11)
RDW: 12.9 % (ref 11.5–15.5)
WBC: 9.2 10*3/uL (ref 4.0–10.5)

## 2015-10-11 NOTE — Telephone Encounter (Signed)
Scheduled SVT ablation for 10/18/15 Pt will stop by office today and have her pre procedure labs drawn. Instructed NPO after MN the night before, hold morning medications the morning of procedure. 3 month f/u on 01/21/16 at 8:45 a.m. Patient verbalized understanding and agreeable to plan.

## 2015-10-11 NOTE — Telephone Encounter (Signed)
Called patient and advised that Claiborne Billings will call her to schedule this tomorrow.

## 2015-10-11 NOTE — Telephone Encounter (Signed)
New Message  Pt calling to speak w/ RN about scheduling her SVT Ablation w/ Dr Lovena Le. Pt was told to also have lab work done beforehand an needs to know when to come in. Please call back and discuss.

## 2015-10-12 ENCOUNTER — Other Ambulatory Visit: Payer: Self-pay | Admitting: *Deleted

## 2015-10-12 LAB — BASIC METABOLIC PANEL
BUN: 10 mg/dL (ref 7–25)
CHLORIDE: 102 mmol/L (ref 98–110)
CO2: 25 mmol/L (ref 20–31)
CREATININE: 0.9 mg/dL (ref 0.50–1.10)
Calcium: 9.4 mg/dL (ref 8.6–10.2)
Glucose, Bld: 80 mg/dL (ref 65–99)
Potassium: 3.3 mmol/L — ABNORMAL LOW (ref 3.5–5.3)
Sodium: 138 mmol/L (ref 135–146)

## 2015-10-18 ENCOUNTER — Encounter (HOSPITAL_COMMUNITY): Payer: Self-pay

## 2015-10-18 ENCOUNTER — Encounter (HOSPITAL_COMMUNITY)
Admission: RE | Disposition: A | Discharge status: Home / Self Care | Payer: Self-pay | Source: Ambulatory Visit | Attending: Internal Medicine

## 2015-10-18 ENCOUNTER — Ambulatory Visit (HOSPITAL_COMMUNITY)
Admission: RE | Admit: 2015-10-18 | Discharge: 2015-10-19 | Disposition: A | Discharge status: Home / Self Care | Payer: BLUE CROSS/BLUE SHIELD | Source: Ambulatory Visit | Attending: Internal Medicine | Admitting: Internal Medicine

## 2015-10-18 DIAGNOSIS — I471 Supraventricular tachycardia: Secondary | ICD-10-CM | POA: Diagnosis present

## 2015-10-18 DIAGNOSIS — I10 Essential (primary) hypertension: Secondary | ICD-10-CM | POA: Diagnosis not present

## 2015-10-18 DIAGNOSIS — E119 Type 2 diabetes mellitus without complications: Secondary | ICD-10-CM | POA: Insufficient documentation

## 2015-10-18 DIAGNOSIS — Z79899 Other long term (current) drug therapy: Secondary | ICD-10-CM | POA: Diagnosis not present

## 2015-10-18 HISTORY — PX: ELECTROPHYSIOLOGIC STUDY: SHX172A

## 2015-10-18 LAB — BASIC METABOLIC PANEL
ANION GAP: 10 (ref 5–15)
BUN: 9 mg/dL (ref 6–20)
CHLORIDE: 107 mmol/L (ref 101–111)
CO2: 21 mmol/L — ABNORMAL LOW (ref 22–32)
Calcium: 9.1 mg/dL (ref 8.9–10.3)
Creatinine, Ser: 0.74 mg/dL (ref 0.44–1.00)
GFR calc Af Amer: >60 mL/min (ref >60)
GLUCOSE: 113 mg/dL — AB (ref 65–99)
POTASSIUM: 3.6 mmol/L (ref 3.5–5.1)
Sodium: 138 mmol/L (ref 135–145)

## 2015-10-18 LAB — PREGNANCY, URINE: PREG TEST UR: NEGATIVE

## 2015-10-18 SURGERY — A-FLUTTER/A-TACH/SVT ABLATION
Anesthesia: LOCAL

## 2015-10-18 MED ORDER — SODIUM CHLORIDE 0.9 % IV SOLN: 250.0000 mL | INTRAVENOUS | Status: DC | PRN

## 2015-10-18 MED ORDER — FENTANYL CITRATE (PF) 100 MCG/2ML IJ SOLN
INTRAMUSCULAR | Status: DC | PRN
  Administered 2015-10-18 (×2): 25 ug via INTRAVENOUS
  Administered 2015-10-18 (×3): 12.5 ug via INTRAVENOUS
  Administered 2015-10-18: 25 ug via INTRAVENOUS
  Administered 2015-10-18: 12.5 ug via INTRAVENOUS
  Administered 2015-10-18: 25 ug via INTRAVENOUS
  Administered 2015-10-18: 12.5 ug via INTRAVENOUS

## 2015-10-18 MED ORDER — FENTANYL CITRATE (PF) 100 MCG/2ML IJ SOLN
INTRAMUSCULAR | Status: AC
  Filled 2015-10-18: qty 4

## 2015-10-18 MED ORDER — ACETAMINOPHEN 325 MG PO TABS
650.0000 mg | ORAL_TABLET | ORAL | Status: DC | PRN
  Administered 2015-10-18: 650 mg via ORAL
  Filled 2015-10-18: qty 2

## 2015-10-18 MED ORDER — DULOXETINE HCL 60 MG PO CPEP
60.0000 mg | ORAL_CAPSULE | Freq: Every day | ORAL | Status: DC
  Administered 2015-10-18 – 2015-10-19 (×2): 60 mg via ORAL
  Filled 2015-10-18 (×2): qty 1

## 2015-10-18 MED ORDER — SODIUM CHLORIDE 0.9 % IJ SOLN: 3.0000 mL | INTRAMUSCULAR | Status: DC | PRN

## 2015-10-18 MED ORDER — HEPARIN (PORCINE) IN NACL 2-0.9 UNIT/ML-% IJ SOLN
INTRAMUSCULAR | Status: DC | PRN
  Administered 2015-10-18: 09:00:00

## 2015-10-18 MED ORDER — IBUTILIDE FUMARATE 1 MG/10ML IV SOLN
1.0000 mg | INTRAVENOUS | Status: DC | PRN
  Administered 2015-10-18: 1 mg via INTRAVENOUS

## 2015-10-18 MED ORDER — TOPIRAMATE 25 MG PO TABS
25.0000 mg | ORAL_TABLET | Freq: Two times a day (BID) | ORAL | Status: DC
  Administered 2015-10-18 – 2015-10-19 (×3): 25 mg via ORAL
  Filled 2015-10-18 (×3): qty 1

## 2015-10-18 MED ORDER — ONDANSETRON HCL 4 MG/2ML IJ SOLN: 4.0000 mg | Freq: Four times a day (QID) | INTRAMUSCULAR | Status: DC | PRN

## 2015-10-18 MED ORDER — HEPARIN (PORCINE) IN NACL 2-0.9 UNIT/ML-% IJ SOLN
INTRAMUSCULAR | Status: AC
  Filled 2015-10-18: qty 500

## 2015-10-18 MED ORDER — FAMOTIDINE 20 MG PO TABS
10.0000 mg | ORAL_TABLET | Freq: Every day | ORAL | Status: DC
  Administered 2015-10-18 – 2015-10-19 (×2): 10 mg via ORAL
  Filled 2015-10-18 (×2): qty 1

## 2015-10-18 MED ORDER — SODIUM CHLORIDE 0.9 % IJ SOLN
3.0000 mL | Freq: Two times a day (BID) | INTRAMUSCULAR | Status: DC
  Administered 2015-10-18 (×2): 3 mL via INTRAVENOUS

## 2015-10-18 MED ORDER — MIDAZOLAM HCL 5 MG/5ML IJ SOLN
INTRAMUSCULAR | Status: AC
Start: 2015-10-18 — End: 2015-10-18
  Filled 2015-10-18: qty 25

## 2015-10-18 MED ORDER — MIDAZOLAM HCL 5 MG/5ML IJ SOLN
INTRAMUSCULAR | Status: AC
  Filled 2015-10-18: qty 25

## 2015-10-18 MED ORDER — MEDROXYPROGESTERONE ACETATE 150 MG/ML IM SUSP: 150.0000 mg | INTRAMUSCULAR | Status: DC

## 2015-10-18 MED ORDER — MIDAZOLAM HCL 5 MG/5ML IJ SOLN
INTRAMUSCULAR | Status: DC | PRN
  Administered 2015-10-18 (×2): 2 mg via INTRAVENOUS
  Administered 2015-10-18 (×2): 1 mg via INTRAVENOUS
  Administered 2015-10-18: 2 mg via INTRAVENOUS
  Administered 2015-10-18 (×5): 1 mg via INTRAVENOUS

## 2015-10-18 MED ORDER — MAGNESIUM SULFATE 50 % IJ SOLN
INTRAMUSCULAR | Status: DC | PRN
  Administered 2015-10-18: 1 g via INTRAVENOUS

## 2015-10-18 MED ORDER — DILTIAZEM HCL ER COATED BEADS 120 MG PO CP24
120.0000 mg | ORAL_CAPSULE | Freq: Every day | ORAL | Status: DC
  Administered 2015-10-18 – 2015-10-19 (×2): 120 mg via ORAL
  Filled 2015-10-18 (×2): qty 1

## 2015-10-18 MED ORDER — DEXTROSE 5 % IV SOLN
INTRAVENOUS | Status: AC
  Filled 2015-10-18: qty 50

## 2015-10-18 MED ORDER — CYANOCOBALAMIN 1000 MCG/ML IJ SOLN
1.0000 ug | INTRAMUSCULAR | Status: DC
Start: 2015-10-18 — End: 2015-10-18

## 2015-10-18 MED ORDER — MAGNESIUM SULFATE 50 % IJ SOLN
INTRAMUSCULAR | Status: AC
  Filled 2015-10-18: qty 2

## 2015-10-18 MED ORDER — ISOPROTERENOL HCL 0.2 MG/ML IJ SOLN
INTRAMUSCULAR | Status: AC
  Filled 2015-10-18: qty 5

## 2015-10-18 MED ORDER — BUPIVACAINE HCL (PF) 0.25 % IJ SOLN
INTRAMUSCULAR | Status: DC | PRN
  Administered 2015-10-18: 31 mL

## 2015-10-18 MED ORDER — IBUTILIDE FUMARATE 1 MG/10ML IV SOLN
INTRAVENOUS | Status: AC
  Filled 2015-10-18: qty 10

## 2015-10-18 MED ORDER — BUPIVACAINE HCL (PF) 0.25 % IJ SOLN
INTRAMUSCULAR | Status: AC
  Filled 2015-10-18: qty 60

## 2015-10-18 MED ORDER — SODIUM CHLORIDE 0.9 % IV SOLN
1.0000 mg | INTRAVENOUS | Status: DC | PRN
  Administered 2015-10-18: 4 ug/min via INTRAVENOUS

## 2015-10-18 SURGICAL SUPPLY — 12 items
BAG SNAP BAND KOVER 36X36 (MISCELLANEOUS) ×2 IMPLANT
CATH CELSIUS THERM D CV 7F (ABLATOR) ×2 IMPLANT
CATH DUODECA HALO/ISMUS 7FR (CATHETERS) ×2 IMPLANT
CATH HEX JOSEPH 2-5-2 65CM 6F (CATHETERS) ×2 IMPLANT
CATH JOSEPHSON QUAD-ALLRED 6FR (CATHETERS) ×4 IMPLANT
PACK EP LATEX FREE (CUSTOM PROCEDURE TRAY) ×1
PACK EP LF (CUSTOM PROCEDURE TRAY) ×1 IMPLANT
PAD DEFIB LIFELINK (PAD) ×2 IMPLANT
SHEATH PINNACLE 6F 10CM (SHEATH) ×4 IMPLANT
SHEATH PINNACLE 7F 10CM (SHEATH) ×2 IMPLANT
SHEATH PINNACLE 8F 10CM (SHEATH) ×4 IMPLANT
SHIELD RADPAD SCOOP 12X17 (MISCELLANEOUS) ×2 IMPLANT

## 2015-10-18 NOTE — Interval H&P Note (Signed)
History and Physical Interval Note:  10/18/2015 8:09 AM  Sarah Holmes  has presented today for surgery, with the diagnosis of SVT  The various methods of treatment have been discussed with the patient and family. After consideration of risks, benefits and other options for treatment, the patient has consented to  Procedure(s): SVT Ablation (N/A) as a surgical intervention .  The patient's history has been reviewed, patient examined, no change in status, stable for surgery.  I have reviewed the patient's chart and labs.  Questions were answered to the patient's satisfaction.     Mikle Bosworth.D.

## 2015-10-18 NOTE — Progress Notes (Signed)
Pt. Has no orders for VTE prophylaxis. Will notify MD.

## 2015-10-18 NOTE — Plan of Care (Signed)
Problem: Activity: Goal: Ability to tolerate increased activity will improve Outcome: Progressing Pt. Is less than 24 hours post op and has an unsteady gate. Will continue to work with pt.  Goal: Mobility will improve Outcome: Progressing Pt responds to activities properly. She has not complained of any pain recently. Will continue to monitor.   Problem: Cardiac: Goal: Ability to maintain an adequate cardiac output will improve Outcome: Progressing No signs of bleeding at this time will continue to monitor pt.   Problem: Nutrition: Goal: Ability to attain and maintain optimal nutritional status will improve Outcome: Completed/Met Date Met:  10/18/15 Pt. Is eating and has diet orders.   Problem: Physical Regulation: Goal: Postoperative complications will be avoided or minimized Outcome: Progressing No signs of infection and pt has voided after procedure.   Problem: Respiratory: Goal: Ability to maintain adequate ventilation will improve Outcome: Progressing Pt. Has not complained of shortness of breath and has O2 sats of 100%

## 2015-10-18 NOTE — Plan of Care (Signed)
Problem: Safety: Goal: Ability to remain free from injury will improve Outcome: Progressing Pt. Is on fall precautions and has been made aware of the importance to contact the nurse when she needs to get out of bed.   Problem: Pain Managment: Goal: General experience of comfort will improve Outcome: Progressing Pt has not expressed any pain need but will continue to monitor   Problem: Tissue Perfusion: Goal: Risk factors for ineffective tissue perfusion will decrease Outcome: Progressing Pt still needs orders for VTE. MD will be notified and made aware.

## 2015-10-18 NOTE — Progress Notes (Signed)
Pt admitted to the unit. Vital signs are stable. No complaints of pain. Will continue to monitor.

## 2015-10-18 NOTE — H&P (View-Only) (Signed)
HPI [name] is referred today by Dr. Caryl Comes for evaluation of SVT. She is a pleasant 44 yo woman with a h/o palpitations dating back several years. She has had recurrent documented SVT and required treatment with IV adenosine. She has been on metoprolol for a couple of years and required uptitration of her metoprolol resulting in fatigue. When she experiences her SVT she will have sob, chest pressure, and near syncope. The longest episode lasted for over a hour in duration. After switching to cardizem and digoxin, she has had break through episodes of SVT, up to 190/min. No Known Allergies   Current Outpatient Prescriptions  Medication Sig Dispense Refill  . cyanocobalamin (,VITAMIN B-12,) 1000 MCG/ML injection Inject 1 mcg into the muscle every 30 (thirty) days.    . digoxin (LANOXIN) 0.125 MG tablet Take 1 tablet by mouth daily.    Marland Kitchen diltiazem (CARDIZEM CD) 120 MG 24 hr capsule Take 1 capsule (120 mg total) by mouth daily. 30 capsule 3  . DULoxetine (CYMBALTA) 60 MG capsule Take 60 mg by mouth daily.    . medroxyPROGESTERone (DEPO-PROVERA) 150 MG/ML injection Inject 150 mg into the muscle every 3 (three) months.    . ranitidine (ZANTAC) 150 MG tablet Take 150 mg by mouth at bedtime.    . topiramate (TOPAMAX) 25 MG tablet Take 1 tablet by mouth 2 (two) times daily.     No current facility-administered medications for this visit.     Past Medical History  Diagnosis Date  . Psoriasis   . Abnormal Pap smear 08/28/09    lgsil  . Abnormal Pap smear 01/29/09    lgsil  . Abnormal uterine bleeding     CONTROLLED W/DEPO PROVERA  . Sleep difficulties   . High triglycerides   . SVT (supraventricular tachycardia) ([place])   . Anxiety     ROS:   All systems reviewed and negative except as noted in the HPI.   Past Surgical History  Procedure Laterality Date  . Breast enhancement surgery  1998    Dr. Wendy Poet  . Endometrial biopsy  03/06/09    low grade squamous CIN1     Family  History  Problem Relation Age of Onset  . Diabetes Mother   . Hypertension Mother   . Cancer Father 51    LUNG   . CVA Father   . Cancer Maternal Grandmother     PANCREATIC  . Cancer Maternal Grandfather     LUNG  . Ovarian cancer Maternal Grandfather   . Diabetes Paternal Grandmother      Social History   Social History  . Marital Status: Married    Spouse Name: N/A  . Number of Children: N/A  . Years of Education: N/A   Occupational History  . Not on file.   Social History Main Topics  . Smoking status: Current Every Day Smoker -- 0.50 packs/day  . Smokeless tobacco: Not on file  . Alcohol Use: Yes     Comment: rare  . Drug Use: No  . Sexual Activity:    Partners: Male    Birth Control/ Protection: None   Other Topics Concern  . Not on file   Social History Narrative     BP 124/76 mmHg  Pulse 86  Ht 5' 5.5" (1.664 m)  Wt 164 lb (74.39 kg)  BMI 26.87 kg/m2  Physical Exam:  Well appearing NAD HEENT: Unremarkable Neck:  No JVD, no thyromegally Lymphatics:  No adenopathy Back:  No CVA tenderness Lungs:  Clear HEART:  Regular rate rhythm, no murmurs, no rubs, no clicks Abd:  soft, positive bowel sounds, no organomegally, no rebound, no guarding Ext:  2 plus pulses, no edema, no cyanosis, no clubbing Skin:  No rashes no nodules Neuro:  CN II through XII intact, motor grossly intact  EKG - NSR with no pre-excitation   Assess/Plan:

## 2015-10-18 NOTE — Progress Notes (Signed)
Site area: right internal jugular a 7 french sheath was removed  Site Prior to Removal:  Level 0  Pressure Applied For 10 MINUTES    Minutes Beginning at 1155am  Manual:   Yes.    Patient Status During Pull:  stable  Post Pull Groin Site:  Level 0  Post Pull Instructions Given:  Yes.    Post Pull Pulses Present:  Yes.    Dressing Applied:  Yes.    Comments:  VS remain stable during sheath pull.

## 2015-10-18 NOTE — Progress Notes (Signed)
Site area: right groin a 6 french and 8 french venous sheath X2 was removed  Site Prior to Removal:  Level 0  Pressure Applied For  20 MINUTES    Minutes Beginning at  1210p  Manual:   Yes.    Patient Status During Pull:  stable  Post Pull Groin Site:  Level 0  Post Pull Instructions Given:  Yes.    Post Pull Pulses Present:  Yes.    Dressing Applied:  Yes.    Comments:  VSS, patient resting comfortably, no distress noted.

## 2015-10-18 NOTE — Progress Notes (Signed)
Patient lying in bed, husband at bedside.  HOB elevated to 30 degrees, scheduled meds along with Tylenol for back pain given. Call light within reach.

## 2015-10-19 ENCOUNTER — Encounter (HOSPITAL_COMMUNITY): Payer: Self-pay | Admitting: Internal Medicine

## 2015-10-19 DIAGNOSIS — I471 Supraventricular tachycardia: Secondary | ICD-10-CM

## 2015-10-19 DIAGNOSIS — I10 Essential (primary) hypertension: Secondary | ICD-10-CM | POA: Diagnosis not present

## 2015-10-19 DIAGNOSIS — E119 Type 2 diabetes mellitus without complications: Secondary | ICD-10-CM | POA: Diagnosis not present

## 2015-10-19 DIAGNOSIS — Z79899 Other long term (current) drug therapy: Secondary | ICD-10-CM | POA: Diagnosis not present

## 2015-10-19 NOTE — Discharge Summary (Signed)
ELECTROPHYSIOLOGY PROCEDURE DISCHARGE SUMMARY    Patient ID: Sarah Holmes,  MRN: [id number], DOB/AGE: [birth year] 44 y.o.  Admit date: 10/18/2015 Discharge date: 10/19/2015  Primary Care [address] MD Primary Cardiologist:  Dr. Caryl Comes  Primary Discharge Diagnosis:  1. SVT  Secondary Discharge Diagnosis:  1. HTN 2. DM  No Known Allergies   Procedures This Admission: A-TACH ABLATION   Brief HPI: Sarah Holmes is a 44 y.o. female who was referred to electrophysiology for SVT, that she had required Adenosine for and was symptomatic with.  She was treated with betablockers and had recurrent palpitations/SVT, switched to cardizem with recurrent episodes and referred for EPS/Ablation.  The pt was seen by Dr. Caryl Comes and referred to Dr. Lovena Le who reviewed with her the risks/benefits of the procedure and alternative therapy options and she wished to proceed.   Hospital Course: The patient was admitted electively yesterday for the purpose of undergoing EPS ablation procedure.  She had an EPS and catheter ablation of atrial tachycardia 10/18/15 with Dr. Lovena Le with no apparent complications.  She had multiple different foci and was held overnight to observe her telemetry.  She had no noted tachycardias and remained in SR.  The patient denies any CP, palpitations or SOB.  She was seen and examined this morning by Dr. Caryl Comes, procedure sites were stable, Vitals remained stable and she was felt stable for discharge.  We will discontinue her digoxin, continue her other medicines without changes.  Follow up appoint and discharge instructions are arranged/provided for the patient  Physical Exam: Filed Vitals:   10/18/15 1647 10/18/15 2232 10/19/15 0454 10/19/15 0944  BP: 116/90 124/78 120/77 129/72  Pulse: 87 89 76   Temp:  98.7 F (37.1 C) 98.6 F (37 C)   TempSrc:  Axillary Axillary   Resp:  16 16   Height:      Weight:      SpO2: 100% 99% 97%     GEN- The patient  is well appearing, alert and oriented x 3 today.   HEENT: normocephalic, atraumatic; sclera clear, conjunctiva pink; hearing intact; oropharynx clear; neck supple, no JVP Lymph- no cervical lymphadenopathy Lungs- Clear to ausculation bilaterally, normal work of breathing.  No wheezes, rales, rhonchi Heart- Regular rate and rhythm, no murmurs, rubs or gallops, PMI not laterally displaced GI- soft, non-tender, non-distended, bowel sounds present, no hepatosplenomegaly Extremities- no clubbing, cyanosis, or edema; DP/PT/radial pulses 2+ bilaterally MS- no significant deformity or atrophy Skin- warm and dry, no rash or lesion Psych- euthymic mood, full affect Neuro- no gross deficits noted    Labs:   Lab Results  Component Value Date   WBC 9.2 10/11/2015   HGB 15.1* 10/11/2015   HCT 42.8 10/11/2015   MCV 99.5 10/11/2015   PLT 219 10/11/2015     Recent Labs Lab 10/18/15 0705  NA 138  K 3.6  CL 107  CO2 21*  BUN 9  CREATININE 0.74  CALCIUM 9.1  GLUCOSE 113*     Discharge Medications:    Medication List    STOP taking these medications        digoxin 0.125 MG tablet  Commonly known as:  LANOXIN      TAKE these medications        cyanocobalamin 1000 MCG/ML injection  Commonly known as:  (VITAMIN B-12)  Inject 1 mcg into the muscle every 30 (thirty) days.     diltiazem 120 MG 24 hr capsule  Commonly known as:  CARDIZEM CD  Take 1 capsule (120 mg total) by mouth daily.     DULoxetine 60 MG capsule  Commonly known as:  CYMBALTA  Take 60 mg by mouth daily.     medroxyPROGESTERone 150 MG/ML injection  Commonly known as:  DEPO-PROVERA  Inject 150 mg into the muscle every 3 (three) months.     ranitidine 150 MG tablet  Commonly known as:  ZANTAC  Take 150 mg by mouth at bedtime.     topiramate 25 MG tablet  Commonly known as:  TOPAMAX  Take 1 tablet by mouth 2 (two) times daily.        Disposition:  Discharge Instructions    Diet - low sodium heart  healthy    Complete by:  As directed      Increase activity slowly    Complete by:  As directed           Follow-up Information    Follow up with Cristopher Peru, MD On 12/04/2015.   Specialty:  Cardiology   Why:  9:30AM   Contact information:   [address] [address] [phone]       Duration of Discharge Encounter: Greater than 30 minutes including physician time.  Venetia Night, PA-C 10/19/2015 11:42 AM

## 2015-10-19 NOTE — Progress Notes (Signed)
10/19/2015 12:17 PM Discharge AVS meds taken today and those due this evening reviewed.  Follow-up appointments and when to call md reviewed.  D/C IV and TELE.  Questions and concerns addressed.   D/C home per orders. [place]

## 2015-10-19 NOTE — Progress Notes (Signed)
       Patient Name: Sarah Holmes      SUBJECTIVE: a little soreness at neck site Quiet night  Past Medical History  Diagnosis Date  . Psoriasis   . Abnormal Pap smear 08/28/09    lgsil  . Abnormal Pap smear 01/29/09    lgsil  . Abnormal uterine bleeding     CONTROLLED W/DEPO PROVERA  . Sleep difficulties   . High triglycerides   . SVT (supraventricular tachycardia) ([place])   . Anxiety     Scheduled Meds:  Scheduled Meds: . diltiazem  120 mg Oral Daily  . DULoxetine  60 mg Oral Daily  . famotidine  10 mg Oral Daily  . sodium chloride  3 mL Intravenous Q12H  . topiramate  25 mg Oral BID   Continuous Infusions:  sodium chloride, acetaminophen, ondansetron (ZOFRAN) IV, sodium chloride    PHYSICAL EXAM Filed Vitals:   10/18/15 1631 10/18/15 1647 10/18/15 2232 10/19/15 0454  BP: 117/78 116/90 124/78 120/77  Pulse: 83 87 89 76  Temp:   98.7 F (37.1 C) 98.6 F (37 C)  TempSrc:   Axillary Axillary  Resp:   16 16  Height:      Weight:      SpO2: 99% 100% 99% 97%    Well developed and nourished in no acute distress HENT normal Neck supple with JVP-flat Clear Regular rate and rhythm, no murmurs or gallops Abd-soft with active BS No Clubbing cyanosis edema Skin-warm and dry A & Oriented  Grossly normal sensory and motor function   TELEMETRY: Reviewed telemetry pt in without recurrent SVT:    Intake/Output Summary (Last 24 hours) at 10/19/15 0719 Last data filed at 10/18/15 2225  Gross per 24 hour  Intake    243 ml  Output    600 ml  Net   -357 ml    LABS: Basic Metabolic Panel:  Recent Labs Lab 10/18/15 0705  NA 138  K 3.6  CL 107  CO2 21*  GLUCOSE 113*  BUN 9  CREATININE 0.74  CALCIUM 9.1   Cardiac Enzymes: No results for input(s): CKTOTAL, CKMB, CKMBINDEX, TROPONINI in the last 72 hours. CBC: No results for input(s): WBC, NEUTROABS, HGB, HCT, MCV, PLT in the last 168 hours. PROTIME: No results for input(s): LABPROT, INR in the  last 72 hours. Liver Function Tests: No results for input(s): AST, ALT, ALKPHOS, BILITOT, PROT, ALBUMIN in the last 72 hours. No results for input(s): LIPASE, AMYLASE in the last 72 hours. BNP: BNP (last 3 results) No results for input(s): BNP in the last 8760 hours.   ASSESSMENT AND PLAN:  Active Problems:   Supraventricular tachycardia ([facility])   SVT (supraventricular tachycardia) ([place])  Ablation of three separate foci of atrial tach resultes reviewed Instructions given followup arrnged  Will let marshall anderson know  Signed, Virl Axe MD  10/19/2015

## 2015-10-19 NOTE — Discharge Instructions (Signed)
No driving for 4 days. No lifting over 5 lbs for 1 week. No sexual activity for 1 week. You may return to work 10/23/15. Keep procedure site clean & dry. If you notice increased pain, swelling, bleeding or pus, call/return!  You may shower, but no soaking baths/hot tubs/pools for 1 week.

## 2015-10-22 ENCOUNTER — Institutional Professional Consult (permissible substitution): Payer: BLUE CROSS/BLUE SHIELD | Admitting: Internal Medicine

## 2015-12-04 ENCOUNTER — Ambulatory Visit (INDEPENDENT_AMBULATORY_CARE_PROVIDER_SITE_OTHER): Payer: BLUE CROSS/BLUE SHIELD | Admitting: Internal Medicine

## 2015-12-04 ENCOUNTER — Encounter: Payer: Self-pay | Admitting: Internal Medicine

## 2015-12-04 VITALS — BP 128/84 | HR 93 | Ht 65.5 in | Wt 164.0 lb

## 2015-12-04 DIAGNOSIS — Z720 Tobacco use: Secondary | ICD-10-CM | POA: Diagnosis not present

## 2015-12-04 DIAGNOSIS — I471 Supraventricular tachycardia: Secondary | ICD-10-CM | POA: Diagnosis not present

## 2015-12-04 NOTE — Progress Notes (Signed)
HPI Sarah Holmes returns today for followup. She is a pleasant 44 yo woman with a h/o SVT who underwent catheter ablation and was found to have 3 different atrial tachycardia foci, all successfully ablated at 11, 9, and 7 o'clock on the tricuspid valve annulus. In the interim she has done well. No recurrent SVT. She remains on low dose cardizem but she also has HTN.  No Known Allergies   Current Outpatient Prescriptions  Medication Sig Dispense Refill  . cyanocobalamin (,VITAMIN B-12,) 1000 MCG/ML injection Inject 1 mcg into the muscle every 30 (thirty) days.    Marland Kitchen diltiazem (CARDIZEM CD) 120 MG 24 hr capsule Take 1 capsule (120 mg total) by mouth daily. 30 capsule 3  . DULoxetine (CYMBALTA) 60 MG capsule Take 60 mg by mouth daily.    . medroxyPROGESTERone (DEPO-PROVERA) 150 MG/ML injection Inject 150 mg into the muscle every 3 (three) months.    . ranitidine (ZANTAC) 150 MG tablet Take 150 mg by mouth at bedtime.    . topiramate (TOPAMAX) 25 MG tablet Take 1 tablet by mouth 2 (two) times daily.     No current facility-administered medications for this visit.     Past Medical History  Diagnosis Date  . Psoriasis   . Abnormal Pap smear 08/28/09    lgsil  . Abnormal Pap smear 01/29/09    lgsil  . Abnormal uterine bleeding     CONTROLLED W/DEPO PROVERA  . Sleep difficulties   . High triglycerides   . SVT (supraventricular tachycardia) ([place])   . Anxiety     ROS:   All systems reviewed and negative except as noted in the HPI.   Past Surgical History  Procedure Laterality Date  . Breast enhancement surgery  1998    Dr. Wendy Poet  . Endometrial biopsy  03/06/09    low grade squamous CIN1  . Electrophysiologic study N/A 10/18/2015    Procedure: SVT Ablation;  Surgeon: Evans Lance, MD;  Location: [facility];  Service: Cardiovascular;  Laterality: N/A;     Family History  Problem Relation Age of Onset  . Diabetes Mother   . Hypertension Mother   . Cancer Father  67    LUNG   . CVA Father   . Cancer Maternal Grandmother     PANCREATIC  . Cancer Maternal Grandfather     LUNG  . Ovarian cancer Maternal Grandfather   . Diabetes Paternal Grandmother      Social History   Social History  . Marital Status: Married    Spouse Name: N/A  . Number of Children: N/A  . Years of Education: N/A   Occupational History  . Not on file.   Social History Main Topics  . Smoking status: Current Every Day Smoker -- 0.50 packs/day  . Smokeless tobacco: Not on file  . Alcohol Use: Yes     Comment: rare  . Drug Use: No  . Sexual Activity:    Partners: Male    Birth Control/ Protection: None   Other Topics Concern  . Not on file   Social History Narrative     BP 128/84 mmHg  Pulse 93  Ht 5' 5.5" (1.664 m)  Wt 164 lb (74.39 kg)  BMI 26.87 kg/m2  Physical Exam:  Well appearing NAD HEENT: Unremarkable Neck:  No JVD, no thyromegally Lymphatics:  No adenopathy Back:  No CVA tenderness Lungs:  Clear HEART:  Regular rate rhythm, no murmurs, no rubs, no clicks  Abd:  soft, positive bowel sounds, no organomegally, no rebound, no guarding Ext:  2 plus pulses, no edema, no cyanosis, no clubbing Skin:  No rashes no nodules Neuro:  CN II through XII intact, motor grossly intact  EKG -  nsr with borderline LAE   Assess/Plan:

## 2015-12-04 NOTE — Assessment & Plan Note (Signed)
She is doing well s/p ablation. She will undergo watchful waiting.

## 2015-12-04 NOTE — Assessment & Plan Note (Signed)
She is encouraged to stop smoking

## 2015-12-04 NOTE — Patient Instructions (Signed)
Medication Instructions:  Your physician recommends that you continue on your current medications as directed. Please refer to the Current Medication list given to you today.  Labwork: None ordered.  Testing/Procedures: None ordered.  Follow-Up: Your physician recommends that you schedule a follow-up appointment as needed.   Any Other Special Instructions Will Be Listed Below (If Applicable).     If you need a refill on your cardiac medications before your next appointment, please call your pharmacy.   

## 2016-01-18 ENCOUNTER — Other Ambulatory Visit: Payer: Self-pay | Admitting: Internal Medicine

## 2016-01-18 DIAGNOSIS — Z79899 Other long term (current) drug therapy: Secondary | ICD-10-CM

## 2016-01-18 DIAGNOSIS — I471 Supraventricular tachycardia: Secondary | ICD-10-CM

## 2016-01-18 DIAGNOSIS — R55 Syncope and collapse: Secondary | ICD-10-CM

## 2016-01-18 MED ORDER — DILTIAZEM HCL ER COATED BEADS 120 MG PO CP24: 120.0000 mg | ORAL_CAPSULE | Freq: Every day | ORAL | Status: AC

## 2016-01-21 ENCOUNTER — Ambulatory Visit: Payer: BLUE CROSS/BLUE SHIELD | Admitting: Internal Medicine

## 2016-02-11 ENCOUNTER — Other Ambulatory Visit: Payer: Self-pay | Admitting: Internal Medicine

## 2016-02-18 ENCOUNTER — Other Ambulatory Visit: Payer: Self-pay | Admitting: Internal Medicine

## 2016-04-15 ENCOUNTER — Emergency Department
Admission: EM | Admit: 2016-04-15 | Discharge: 2016-04-16 | Disposition: A | Discharge status: Home / Self Care | Payer: BLUE CROSS/BLUE SHIELD | Attending: Emergency Medicine | Admitting: Emergency Medicine

## 2016-04-15 DIAGNOSIS — Y999 Unspecified external cause status: Secondary | ICD-10-CM | POA: Insufficient documentation

## 2016-04-15 DIAGNOSIS — Y939 Activity, unspecified: Secondary | ICD-10-CM | POA: Diagnosis not present

## 2016-04-15 DIAGNOSIS — H5711 Ocular pain, right eye: Secondary | ICD-10-CM | POA: Diagnosis present

## 2016-04-15 DIAGNOSIS — Y929 Unspecified place or not applicable: Secondary | ICD-10-CM | POA: Diagnosis not present

## 2016-04-15 DIAGNOSIS — S0501XA Injury of conjunctiva and corneal abrasion without foreign body, right eye, initial encounter: Secondary | ICD-10-CM | POA: Insufficient documentation

## 2016-04-15 DIAGNOSIS — X58XXXA Exposure to other specified factors, initial encounter: Secondary | ICD-10-CM | POA: Diagnosis not present

## 2016-04-15 DIAGNOSIS — F17200 Nicotine dependence, unspecified, uncomplicated: Secondary | ICD-10-CM | POA: Diagnosis not present

## 2016-04-15 DIAGNOSIS — Z79899 Other long term (current) drug therapy: Secondary | ICD-10-CM | POA: Diagnosis not present

## 2016-04-15 MED ORDER — TETRACAINE HCL 0.5 % OP SOLN
OPHTHALMIC | Status: AC
  Administered 2016-04-15: 1 [drp]
  Filled 2016-04-15: qty 2

## 2016-04-15 MED ORDER — CIPROFLOXACIN HCL 0.3 % OP SOLN: 1.0000 [drp] | OPHTHALMIC | Status: DC

## 2016-04-15 MED ORDER — FLUORESCEIN SODIUM 1 MG OP STRP
1.0000 | ORAL_STRIP | Freq: Once | OPHTHALMIC | Status: AC
  Administered 2016-04-15: 1 via OPHTHALMIC
  Filled 2016-04-15: qty 1

## 2016-04-15 MED ORDER — CIPROFLOXACIN HCL 0.3 % OP SOLN: 1.0000 [drp] | OPHTHALMIC | Status: AC

## 2016-04-15 NOTE — Discharge Instructions (Signed)

## 2016-04-15 NOTE — ED Notes (Signed)
Pt assessed by PA.

## 2016-04-15 NOTE — ED Provider Notes (Signed)
[facility] Emergency Department Provider Note ____________________________________________  Time seen: Approximately 11:34 PM  I have reviewed the triage vital signs and the nursing notes.   HISTORY  Chief Complaint Eye Pain   HPI Sarah Holmes is a 45 y.o. female who presents to the emergency department with right eye pain. She states that she was poked in the eye with a palm leaf about a week ago and has been having pain in the eye since. She has been using over-the-counter eye lubricating drops, but tonight the pain began to get worseand she actually brushed a foreign body out of the right eye.  Past Medical History  Diagnosis Date  . Psoriasis   . Abnormal Pap smear 08/28/09    lgsil  . Abnormal Pap smear 01/29/09    lgsil  . Abnormal uterine bleeding     CONTROLLED W/DEPO PROVERA  . Sleep difficulties   . High triglycerides   . SVT (supraventricular tachycardia) ([place])   . Anxiety     Patient Active Problem List   Diagnosis Date Noted  . SVT (supraventricular tachycardia) ([place]) 10/18/2015  . Psoriasis 10/10/2015  . Supraventricular tachycardia ([place]) 10/10/2015  . Current tobacco use 10/10/2015  . Anxiety, generalized 09/03/2015  . Smoking 1/2 pack a day or less 03/10/2012    Past Surgical History  Procedure Laterality Date  . Breast enhancement surgery  1998    Dr. Wendy Poet  . Endometrial biopsy  03/06/09    low grade squamous CIN1  . Electrophysiologic study N/A 10/18/2015    Procedure: SVT Ablation;  Surgeon: Evans Lance, MD;  Location: [facility];  Service: Cardiovascular;  Laterality: N/A;    Current Outpatient Rx  Name  Route  Sig  Dispense  Refill  . ciprofloxacin (CILOXAN) 0.3 % ophthalmic solution   Right Eye   Place 1 drop into the right eye every 4 (four) hours while awake. 1 drop, every 4 hours, while awake, for the next 5 days.   5 mL   0   . cyanocobalamin (,VITAMIN B-12,) 1000 MCG/ML injection  Intramuscular   Inject 1 mcg into the muscle every 30 (thirty) days.         Marland Kitchen diltiazem (CARDIZEM CD) 120 MG 24 hr capsule   Oral   Take 1 capsule (120 mg total) by mouth daily.   90 capsule   3     Stopping Metoprolol.   . DULoxetine (CYMBALTA) 60 MG capsule   Oral   Take 60 mg by mouth daily.         . medroxyPROGESTERone (DEPO-PROVERA) 150 MG/ML injection   Intramuscular   Inject 150 mg into the muscle every 3 (three) months.         . ranitidine (ZANTAC) 150 MG tablet   Oral   Take 150 mg by mouth at bedtime.         . topiramate (TOPAMAX) 25 MG tablet   Oral   Take 1 tablet by mouth 2 (two) times daily.           Allergies Review of patient's allergies indicates no known allergies.  Family History  Problem Relation Age of Onset  . Diabetes Mother   . Hypertension Mother   . Cancer Father 58    LUNG   . CVA Father   . Cancer Maternal Grandmother     PANCREATIC  . Cancer Maternal Grandfather     LUNG  . Ovarian cancer Maternal Grandfather   .  Diabetes Paternal Grandmother     Social History Social History  Substance Use Topics  . Smoking status: Current Every Day Smoker -- 0.50 packs/day  . Smokeless tobacco: Not on file  . Alcohol Use: Yes     Comment: rare    Review of Systems   Constitutional: No fever/chills Eyes: Negative for visual changes. Positive for pain. Musculoskeletal: Negative for pain. Skin: Negative for rash. Neurological: Negative for headaches, focal weakness or numbness. Allergic: Negative for seasonal allergies. ____________________________________________  PHYSICAL EXAM:  VITAL SIGNS: ED Triage Vitals  Enc Vitals Group     BP 04/15/16 2217 168/90 mmHg     Pulse Rate 04/15/16 2217 92     Resp 04/15/16 2217 18     Temp 04/15/16 2217 98.7 F (37.1 C)     Temp Source 04/15/16 2217 Oral     SpO2 04/15/16 2217 100 %     Weight 04/15/16 2217 160 lb (72.576 kg)     Height 04/15/16 2217 5\' 5"  (1.651 m)     Head  Cir --      Peak Flow --      Pain Score 04/15/16 2218 5     Pain Loc --      Pain Edu? --      Excl. in [place]? --     Constitutional: Alert and oriented. Well appearing and in no acute distress. Eyes: Visual acuity--see nursing documentation; no globe trauma; Eyelids normal to inspection; Sclera appears erythematous.  Eyelids were inverted. Conjunctiva appears erythematous; Cornea has an abrasion to the 12 o'clock position. Head: Atraumatic. Nose: No congestion/rhinnorhea. Mouth/Throat: Mucous membranes are moist.  Oropharynx non-erythematous. Respiratory: Even and unlabored. Musculoskeletal:Normal ROM x 4 extremities. Neurologic:  Normal speech and language. No gross focal neurologic deficits are appreciated. Speech is normal. No gait instability. Skin:  Skin is warm, dry and intact. No rash noted. Psychiatric: Mood and affect are normal. Speech and behavior are normal.  ____________________________________________   LABS (all labs ordered are listed, but only abnormal results are displayed)  Labs Reviewed - No data to display ____________________________________________  EKG   ____________________________________________  RADIOLOGY   ____________________________________________   PROCEDURES  Procedure(s) performed: None  ____________________________________________   INITIAL IMPRESSION / ASSESSMENT AND PLAN / ED COURSE  Pertinent labs & imaging results that were available during my care of the patient were reviewed by me and considered in my medical decision making (see chart for details).  Patient to receive prescriptions for Cipro gtt.  She was advised to follow up with opthalmology for symptoms that are not improving over the next 2 days. She was  also advised to return to the ER for symptoms that change or worsen if unable to schedule an appointment.  ____________________________________________   FINAL CLINICAL IMPRESSION(S) / ED DIAGNOSES  Final  diagnoses:  Corneal abrasion, right, initial encounter       Victorino Dike, FNP 04/15/16 [place], MD 04/17/16 [phone]

## 2016-04-15 NOTE — ED Notes (Signed)
Pt in with co right eye pain since she piece of leaf flew into eye over a week ago.

## 2017-03-14 ENCOUNTER — Other Ambulatory Visit: Payer: Self-pay | Admitting: Internal Medicine

## 2017-03-14 DIAGNOSIS — R55 Syncope and collapse: Secondary | ICD-10-CM

## 2017-03-14 DIAGNOSIS — I471 Supraventricular tachycardia: Secondary | ICD-10-CM

## 2017-03-14 DIAGNOSIS — Z79899 Other long term (current) drug therapy: Secondary | ICD-10-CM

## 2017-04-03 DIAGNOSIS — I471 Supraventricular tachycardia: Secondary | ICD-10-CM | POA: Diagnosis not present

## 2017-04-03 DIAGNOSIS — E538 Deficiency of other specified B group vitamins: Secondary | ICD-10-CM | POA: Diagnosis not present

## 2017-04-03 DIAGNOSIS — F411 Generalized anxiety disorder: Secondary | ICD-10-CM | POA: Diagnosis not present

## 2017-04-03 DIAGNOSIS — L409 Psoriasis, unspecified: Secondary | ICD-10-CM | POA: Diagnosis not present

## 2017-04-17 ENCOUNTER — Encounter: Payer: Self-pay | Admitting: Emergency Medicine

## 2017-04-17 ENCOUNTER — Emergency Department
Admission: EM | Admit: 2017-04-17 | Discharge: 2017-04-17 | Disposition: A | Discharge status: Home / Self Care | Payer: BLUE CROSS/BLUE SHIELD | Attending: Emergency Medicine | Admitting: Emergency Medicine

## 2017-04-17 DIAGNOSIS — F17200 Nicotine dependence, unspecified, uncomplicated: Secondary | ICD-10-CM | POA: Diagnosis not present

## 2017-04-17 DIAGNOSIS — I471 Supraventricular tachycardia: Secondary | ICD-10-CM | POA: Diagnosis not present

## 2017-04-17 DIAGNOSIS — R000 Tachycardia, unspecified: Secondary | ICD-10-CM | POA: Diagnosis present

## 2017-04-17 LAB — BASIC METABOLIC PANEL
ANION GAP: 7 (ref 5–15)
BUN: 10 mg/dL (ref 6–20)
CHLORIDE: 106 mmol/L (ref 101–111)
CO2: 28 mmol/L (ref 22–32)
Calcium: 9.2 mg/dL (ref 8.9–10.3)
Creatinine, Ser: 0.78 mg/dL (ref 0.44–1.00)
Glucose, Bld: 91 mg/dL (ref 65–99)
POTASSIUM: 3.3 mmol/L — AB (ref 3.5–5.1)
SODIUM: 141 mmol/L (ref 135–145)

## 2017-04-17 LAB — CBC
HCT: 48.6 % — ABNORMAL HIGH (ref 35.0–47.0)
Hemoglobin: 17.3 g/dL — ABNORMAL HIGH (ref 12.0–16.0)
MCH: 36.7 pg — ABNORMAL HIGH (ref 26.0–34.0)
MCHC: 35.5 g/dL (ref 32.0–36.0)
MCV: 103.4 fL — ABNORMAL HIGH (ref 80.0–100.0)
PLATELETS: 258 10*3/uL (ref 150–440)
RBC: 4.71 MIL/uL (ref 3.80–5.20)
RDW: 13.3 % (ref 11.5–14.5)
WBC: 15.8 10*3/uL — AB (ref 3.6–11.0)

## 2017-04-17 LAB — TROPONIN I

## 2017-04-17 MED ORDER — ADENOSINE 12 MG/4ML IV SOLN
INTRAVENOUS | Status: AC
  Administered 2017-04-17: 6 mg via INTRAVENOUS
  Filled 2017-04-17: qty 4

## 2017-04-17 MED ORDER — SODIUM CHLORIDE 0.9 % IV BOLUS (SEPSIS)
1000.0000 mL | Freq: Once | INTRAVENOUS | Status: AC
  Administered 2017-04-17: 1000 mL via INTRAVENOUS

## 2017-04-17 MED ORDER — ADENOSINE 6 MG/2ML IV SOLN
6.0000 mg | Freq: Once | INTRAVENOUS | Status: AC
  Administered 2017-04-17: 6 mg via INTRAVENOUS

## 2017-04-17 MED ORDER — MORPHINE SULFATE (PF) 4 MG/ML IV SOLN
INTRAVENOUS | Status: AC
  Filled 2017-04-17: qty 1

## 2017-04-17 MED ORDER — ADENOSINE 6 MG/2ML IV SOLN
INTRAVENOUS | Status: AC
  Administered 2017-04-17: 12 mg via INTRAVENOUS
  Filled 2017-04-17: qty 2

## 2017-04-17 MED ORDER — ADENOSINE 12 MG/4ML IV SOLN
12.0000 mg | Freq: Once | INTRAVENOUS | Status: AC
  Administered 2017-04-17: 12 mg via INTRAVENOUS

## 2017-04-17 NOTE — ED Provider Notes (Signed)
[facility] Emergency Department Provider Note   First MD Initiated Contact with Patient 04/17/17 [phone]     (approximate)  I have reviewed the triage vital signs and the nursing notes.   HISTORY  Chief Complaint Tachycardia   HPI Sarah Holmes is a 46 y.o. female with blow list of chronic medical conditions including supraventricular tachycardia status post ablation in 2016 presents emergency Department with persistent rapid heartbeat times approximately 2 hours. Patient denies any chest pain or shortness of breath no dizziness no nausea or vomiting. Patient noted to be in SVT on monitor.   Past Medical History:  Diagnosis Date  . Abnormal Pap smear 08/28/09   lgsil  . Abnormal Pap smear 01/29/09   lgsil  . Abnormal uterine bleeding    CONTROLLED W/DEPO PROVERA  . Anxiety   . High triglycerides   . Psoriasis   . Sleep difficulties   . SVT (supraventricular tachycardia) [facility])     Patient Active Problem List   Diagnosis Date Noted  . SVT (supraventricular tachycardia) ([place]) 10/18/2015  . Psoriasis 10/10/2015  . Supraventricular tachycardia ([place]) 10/10/2015  . Current tobacco use 10/10/2015  . Anxiety, generalized 09/03/2015  . Smoking 1/2 pack a day or less 03/10/2012    Past Surgical History:  Procedure Laterality Date  . BREAST ENHANCEMENT SURGERY  1998   Dr. Wendy Poet  . ELECTROPHYSIOLOGIC STUDY N/A 10/18/2015   Procedure: SVT Ablation;  Surgeon: Evans Lance, MD;  Location: [place] [facility];  Service: Cardiovascular;  Laterality: N/A;  . ENDOMETRIAL BIOPSY  03/06/09   low grade squamous CIN1    Prior to Admission medications   Medication Sig Start Date End Date Taking? Authorizing Provider  cyanocobalamin (,VITAMIN B-12,) 1000 MCG/ML injection Inject 1 mcg into the muscle every 30 (thirty) days. 09/03/15   Historical Provider, MD  diltiazem (CARDIZEM CD) 120 MG 24 hr capsule Take 1 capsule (120 mg total) by mouth daily. 01/18/16   Evans Lance, MD  DULoxetine (CYMBALTA) 60 MG capsule Take 60 mg by mouth daily. 04/09/15   Historical Provider, MD  medroxyPROGESTERone (DEPO-PROVERA) 150 MG/ML injection Inject 150 mg into the muscle every 3 (three) months.    Historical Provider, MD  ranitidine (ZANTAC) 150 MG tablet Take 150 mg by mouth at bedtime.    Historical Provider, MD  topiramate (TOPAMAX) 25 MG tablet Take 1 tablet by mouth 2 (two) times daily. 09/03/15 09/02/16  Historical Provider, MD    Allergies No known drug allergies  Family History  Problem Relation Age of Onset  . Diabetes Mother   . Hypertension Mother   . Cancer Father 65    LUNG   . CVA Father   . Cancer Maternal Grandmother     PANCREATIC  . Cancer Maternal Grandfather     LUNG  . Ovarian cancer Maternal Grandfather   . Diabetes Paternal Grandmother     Social History Social History  Substance Use Topics  . Smoking status: Current Every Day Smoker    Packs/day: 0.50  . Smokeless tobacco: Never Used  . Alcohol use Yes     Comment: rare    Review of Systems Constitutional: No fever/chills Eyes: No visual changes. ENT: No sore throat. Cardiovascular: Denies chest pain.Positive for rapid heartbeat Respiratory: Denies shortness of breath. Gastrointestinal: No abdominal pain.  No nausea, no vomiting.  No diarrhea.  No constipation. Genitourinary: Negative for dysuria. Musculoskeletal: Negative for back pain. Integumentary: Negative for rash. Neurological: Negative for  headaches, focal weakness or numbness.   ____________________________________________   PHYSICAL EXAM:  VITAL SIGNS: ED Triage Vitals  Enc Vitals Group     BP 04/17/17 0523 116/77     Pulse Rate 04/17/17 0523 (!) 176     Resp 04/17/17 0523 16     Temp 04/17/17 0523 98.4 F (36.9 C)     Temp Source 04/17/17 0523 Oral     SpO2 04/17/17 0523 100 %     Weight 04/17/17 0524 160 lb (72.6 kg)     Height 04/17/17 0524 5\' 5"  (1.651 m)     Head Circumference --       Peak Flow --      Pain Score 04/17/17 0524 0     Pain Loc --      Pain Edu? --      Excl. in [place]? --     Constitutional: Alert and oriented. Well appearing and in no acute distress. Eyes: Conjunctivae are normal. PERRL. EOMI. Head: Atraumatic. Mouth/Throat: Mucous membranes are moist.  Oropharynx non-erythematous. Neck: No stridor.   Cardiovascular: Tachycardia, regular rhythm. Good peripheral circulation. Grossly normal heart sounds. Respiratory: Normal respiratory effort.  No retractions. Lungs CTAB. Gastrointestinal: Soft and nontender. No distention.  Musculoskeletal: No lower extremity tenderness nor edema. No gross deformities of extremities. Neurologic:  Normal speech and language. No gross focal neurologic deficits are appreciated.  Skin:  Skin is warm, dry and intact. No rash noted. Psychiatric: Mood and affect are normal. Speech and behavior are normal.  ____________________________________________   LABS (all labs ordered are listed, but only abnormal results are displayed)  Labs Reviewed  CBC - Abnormal; Notable for the following:       Result Value   WBC 15.8 (*)    Hemoglobin 17.3 (*)    HCT 48.6 (*)    MCV 103.4 (*)    MCH 36.7 (*)    All other components within normal limits  BASIC METABOLIC PANEL - Abnormal; Notable for the following:    Potassium 3.3 (*)    All other components within normal limits  TROPONIN I   ____________________________________________  EKG  ED ECG REPORT I, [place] N BROWN, the attending physician, personally viewed and interpreted this ECG.   Date: 04/17/2017  EKG Time: 5:24 AM  Rate: 173  Rhythm: Superventricular tachycardia  Axis: Normal  Intervals: Normal  ST&T Change: None   PROCEDURES  Critical Care performed: CRITICAL CARE Performed by: Gregor Hams   Total critical care time: 30 minutes  Critical care time was exclusive of separately billable procedures and treating other patients.  Critical care was  necessary to treat or prevent imminent or life-threatening deterioration.  Critical care was time spent personally by me on the following activities: development of treatment plan with patient and/or surrogate as well as nursing, discussions with consultants, evaluation of patient's response to treatment, examination of patient, obtaining history from patient or surrogate, ordering and performing treatments and interventions, ordering and review of laboratory studies, ordering and review of radiographic studies, pulse oximetry and re-evaluation of patient's condition.     Procedures   ____________________________________________   INITIAL IMPRESSION / ASSESSMENT AND PLAN / ED COURSE  Pertinent labs & imaging results that were available during my care of the patient were reviewed by me and considered in my medical decision making (see chart for details).  46 year old female with history of superventricular tachycardia status post ablation in 2016 presents to the emergency department in SVT with a heart rate  of 173. Patient given 6 mg of adenosine without resolution of SVT. I then administered 12 mg of adenosine with resolution of SVT. Patient remained stable with no further episodes of SVT while in the emergency department laboratory data unremarkable. Patient patient's cardiologist is Dr. Tyler Deis as such she will be referred to Dr. Caryl Comes for further outpatient evaluation and management. Patient is currently taking Cardizem      ____________________________________________  FINAL CLINICAL IMPRESSION(S) / ED DIAGNOSES  Final diagnoses:  SVT (supraventricular tachycardia) (HCC)     MEDICATIONS GIVEN DURING THIS VISIT:  Medications  adenosine (ADENOCARD) 6 MG/2ML injection 6 mg (6 mg Intravenous Given 04/17/17 0532)  adenosine (ADENOCARD) 12 MG/4ML injection 12 mg (12 mg Intravenous Given 04/17/17 0534)  sodium chloride 0.9 % bolus 1,000 mL (1,000 mLs Intravenous New Bag/Given 04/17/17  0530)     NEW OUTPATIENT MEDICATIONS STARTED DURING THIS VISIT:  New Prescriptions   No medications on file    Modified Medications   No medications on file    Discontinued Medications   No medications on file     Note:  This document was prepared using Dragon voice recognition software and may include unintentional dictation errors.    Gregor Hams, MD 04/17/17 [phone]

## 2017-04-17 NOTE — ED Notes (Signed)
Dr Owens Shark at bedside; card monitor and EKG cont rhythm strip in place; SO at bedside; adenosine admin as ordered; pt remains SVT 170's

## 2017-04-17 NOTE — ED Notes (Signed)
Pt resting in bed, eyes closed, resp even and unlabored, family at bedside, denies any pain or needs

## 2017-04-17 NOTE — ED Notes (Signed)
2nd ds adenosine admin as ordered; pt tolerated well; now ST 120

## 2017-04-17 NOTE — ED Triage Notes (Signed)
Pt ambulatory to triage in NAD, report feeling heart race, up to rate 200.  EKG completed, pt taken to tx rm 9

## 2017-04-20 ENCOUNTER — Telehealth: Payer: Self-pay | Admitting: Internal Medicine

## 2017-04-20 NOTE — Telephone Encounter (Signed)
Patient was seen in ED 04-17-17.. Had an ablation in the past and was told to follow-up with cardiologist on Monday. Patient would like to know if she can  Be worked in sooner than her 05-06-17 appointment. Thanks.

## 2017-04-20 NOTE — Telephone Encounter (Signed)
Pt saw Dr Lovena Le last in December 2016, pt advised she has been scheduled to see Dr Lovena Le 04/21/17 10:45 AM [employer].

## 2017-04-21 ENCOUNTER — Encounter: Payer: Self-pay | Admitting: Internal Medicine

## 2017-04-21 ENCOUNTER — Ambulatory Visit (INDEPENDENT_AMBULATORY_CARE_PROVIDER_SITE_OTHER): Payer: BLUE CROSS/BLUE SHIELD | Admitting: Internal Medicine

## 2017-04-21 VITALS — BP 110/76 | HR 86 | Ht 65.5 in | Wt 166.8 lb

## 2017-04-21 DIAGNOSIS — I471 Supraventricular tachycardia: Secondary | ICD-10-CM | POA: Diagnosis not present

## 2017-04-21 NOTE — Patient Instructions (Addendum)
Medication Instructions:  Please continue all medications as listed.  Labwork:  Lab appointment for BMET/CBC on 05/20/17  Testing/Procedures: Your physician has recommended that you have an ablation. Catheter ablation is a medical procedure used to treat some cardiac arrhythmias (irregular heartbeats). During catheter ablation, a long, thin, flexible tube is put into a blood vessel in your groin (upper thigh), or neck. This tube is called an ablation catheter. It is then guided to your heart through the blood vessel. Radio frequency waves destroy small areas of heart tissue where abnormal heartbeats may cause an arrhythmia to start. Please see the instruction sheet given to you today.  Follow-Up: Follow-up appointment 4 weeks after procedure with Dr. Lovena Le.   Any Other Special Instructions Will Be Listed Below (If Applicable).  Please report to the [facility] on 05/27/17 at 6:30 AM.  Nothing to eat or drink after midnight prior to procedure  Plan 1 night stay     If you need a refill on your cardiac medications before your next appointment, please call your pharmacy.

## 2017-04-21 NOTE — Progress Notes (Signed)
HPI [name] returns today for followup. She is a pleasant 46 yo woman with a h/o SVT who underwent catheter ablation and was found to have 3 different atrial tachycardia foci, all successfully ablated at 11, 9, and 7 o'clock on the tricuspid valve annulus. In the interim she hasd done well until a week ago when she was awakened from sleep with recurrent palpitations and went to the ED where she was found to be in a narrow complex tachycardia at 173/min. The patient was treated with 2 rounds of IV adenosine and her SVT resolved.  No Known Allergies   Current Outpatient Prescriptions  Medication Sig Dispense Refill  . cyanocobalamin (,VITAMIN B-12,) 1000 MCG/ML injection Inject 1 mcg into the muscle every 30 (thirty) days.    Marland Kitchen diltiazem (CARDIZEM CD) 120 MG 24 hr capsule Take 1 capsule (120 mg total) by mouth daily. 90 capsule 3  . DULoxetine (CYMBALTA) 60 MG capsule Take 60 mg by mouth daily.    . medroxyPROGESTERone (DEPO-PROVERA) 150 MG/ML injection Inject 150 mg into the muscle every 3 (three) months.    . ranitidine (ZANTAC) 150 MG tablet Take 150 mg by mouth at bedtime.    . topiramate (TOPAMAX) 25 MG tablet Take 1 tablet by mouth 2 (two) times daily.     No current facility-administered medications for this visit.      Past Medical History:  Diagnosis Date  . Abnormal Pap smear 08/28/09   lgsil  . Abnormal Pap smear 01/29/09   lgsil  . Abnormal uterine bleeding    CONTROLLED W/DEPO PROVERA  . Anxiety   . High triglycerides   . Psoriasis   . Sleep difficulties   . SVT (supraventricular tachycardia) (HCC)     ROS:   All systems reviewed and negative except as noted in the HPI.   Past Surgical History:  Procedure Laterality Date  . BREAST ENHANCEMENT SURGERY  1998   Dr. Wendy Poet  . ELECTROPHYSIOLOGIC STUDY N/A 10/18/2015   Procedure: SVT Ablation;  Surgeon: Evans Lance, MD;  Location: [place] [facility];  Service: Cardiovascular;  Laterality: N/A;  .  ENDOMETRIAL BIOPSY  03/06/09   low grade squamous CIN1     Family History  Problem Relation Age of Onset  . Diabetes Mother   . Hypertension Mother   . Cancer Father 41    LUNG   . CVA Father   . Cancer Maternal Grandmother     PANCREATIC  . Cancer Maternal Grandfather     LUNG  . Ovarian cancer Maternal Grandfather   . Diabetes Paternal Grandmother      Social History   Social History  . Marital status: Married    Spouse name: N/A  . Number of children: N/A  . Years of education: N/A   Occupational History  . Not on file.   Social History Main Topics  . Smoking status: Current Every Day Smoker    Packs/day: 0.50  . Smokeless tobacco: Never Used  . Alcohol use Yes     Comment: rare  . Drug use: No  . Sexual activity: Yes    Partners: Male    Birth control/ protection: None   Other Topics Concern  . Not on file   Social History Narrative  . No narrative on file     BP 110/76   Pulse 86   Ht 5' 5.5" (1.664 m)   Wt 166 lb 12.8 oz (75.7 kg)   SpO2 98%  BMI 27.33 kg/m   Physical Exam:  Well appearing middle aged woman, NAD HEENT: Unremarkable Neck:  6 cm JVD, no thyromegally Lymphatics:  No adenopathy Back:  No CVA tenderness Lungs:  Clear with no wheezes HEART:  Regular rate rhythm, no murmurs, no rubs, no clicks Abd:  soft, positive bowel sounds, no organomegally, no rebound, no guarding Ext:  2 plus pulses, no edema, no cyanosis, no clubbing Skin:  No rashes no nodules Neuro:  CN II through XII intact, motor grossly intact  EKG -  nsr with borderline LAE   Assess/Plan: 1. SVT - it is not surprising that she has had more SVT with her clinical history. However, the current SVT looks like AVNRT and stopped with adenosine which would be less likely with atrial tachycardia. I gave her 3 options for treatment. One watchful waiting. Two, repeat catheter ablation, and 3, AA drug therapy with a sodium channel blocking drug. She is considering her  options and would like to proceed with ablation. 2. Tobacco abuse - I have encouraged smoking cessation.

## 2017-04-22 DIAGNOSIS — I471 Supraventricular tachycardia: Secondary | ICD-10-CM | POA: Diagnosis not present

## 2017-04-22 DIAGNOSIS — L409 Psoriasis, unspecified: Secondary | ICD-10-CM | POA: Diagnosis not present

## 2017-04-22 DIAGNOSIS — E538 Deficiency of other specified B group vitamins: Secondary | ICD-10-CM | POA: Diagnosis not present

## 2017-04-22 DIAGNOSIS — Z0000 Encounter for general adult medical examination without abnormal findings: Secondary | ICD-10-CM | POA: Diagnosis not present

## 2017-05-06 ENCOUNTER — Ambulatory Visit: Payer: BLUE CROSS/BLUE SHIELD | Admitting: Physician Assistant

## 2017-05-20 ENCOUNTER — Other Ambulatory Visit: Payer: BLUE CROSS/BLUE SHIELD | Admitting: *Deleted

## 2017-05-20 DIAGNOSIS — I471 Supraventricular tachycardia: Secondary | ICD-10-CM

## 2017-05-21 LAB — CBC WITH DIFFERENTIAL/PLATELET
BASOS ABS: 0 10*3/uL (ref 0.0–0.2)
Basos: 0 %
EOS (ABSOLUTE): 0.1 10*3/uL (ref 0.0–0.4)
Eos: 1 %
HEMATOCRIT: 43.3 % (ref 34.0–46.6)
HEMOGLOBIN: 15.7 g/dL (ref 11.1–15.9)
Immature Grans (Abs): 0 10*3/uL (ref 0.0–0.1)
Immature Granulocytes: 0 %
LYMPHS ABS: 2.2 10*3/uL (ref 0.7–3.1)
Lymphs: 20 %
MCH: 37.6 pg — ABNORMAL HIGH (ref 26.6–33.0)
MCHC: 36.3 g/dL — AB (ref 31.5–35.7)
MCV: 104 fL — ABNORMAL HIGH (ref 79–97)
MONOCYTES: 6 %
MONOS ABS: 0.6 10*3/uL (ref 0.1–0.9)
NEUTROS ABS: 8 10*3/uL — AB (ref 1.4–7.0)
Neutrophils: 73 %
Platelets: 217 10*3/uL (ref 150–379)
RBC: 4.18 x10E6/uL (ref 3.77–5.28)
RDW: 12.9 % (ref 12.3–15.4)
WBC: 11 10*3/uL — ABNORMAL HIGH (ref 3.4–10.8)

## 2017-05-21 LAB — BASIC METABOLIC PANEL
BUN/Creatinine Ratio: 13 (ref 9–23)
BUN: 9 mg/dL (ref 6–24)
CALCIUM: 9 mg/dL (ref 8.7–10.2)
CO2: 20 mmol/L (ref 18–29)
CREATININE: 0.71 mg/dL (ref 0.57–1.00)
Chloride: 101 mmol/L (ref 96–106)
GFR calc Af Amer: 118 mL/min/{1.73_m2} (ref >59)
GFR, EST NON AFRICAN AMERICAN: 102 mL/min/{1.73_m2} (ref >59)
Glucose: 115 mg/dL — ABNORMAL HIGH (ref 65–99)
Potassium: 3.6 mmol/L (ref 3.5–5.2)
SODIUM: 138 mmol/L (ref 134–144)

## 2017-05-27 ENCOUNTER — Ambulatory Visit (HOSPITAL_COMMUNITY)
Admission: RE | Admit: 2017-05-27 | Discharge: 2017-05-27 | Disposition: A | Discharge status: Home / Self Care | Payer: BLUE CROSS/BLUE SHIELD | Source: Ambulatory Visit | Attending: Internal Medicine | Admitting: Internal Medicine

## 2017-05-27 ENCOUNTER — Encounter (HOSPITAL_COMMUNITY)
Admission: RE | Disposition: A | Discharge status: Home / Self Care | Payer: Self-pay | Source: Ambulatory Visit | Attending: Internal Medicine

## 2017-05-27 DIAGNOSIS — I471 Supraventricular tachycardia, unspecified: Secondary | ICD-10-CM | POA: Diagnosis present

## 2017-05-27 DIAGNOSIS — F419 Anxiety disorder, unspecified: Secondary | ICD-10-CM | POA: Diagnosis not present

## 2017-05-27 DIAGNOSIS — E781 Pure hyperglyceridemia: Secondary | ICD-10-CM | POA: Insufficient documentation

## 2017-05-27 DIAGNOSIS — F17210 Nicotine dependence, cigarettes, uncomplicated: Secondary | ICD-10-CM | POA: Insufficient documentation

## 2017-05-27 HISTORY — PX: SVT ABLATION: EP1225

## 2017-05-27 LAB — HCG, SERUM, QUALITATIVE: PREG SERUM: NEGATIVE

## 2017-05-27 SURGERY — SVT ABLATION

## 2017-05-27 MED ORDER — SODIUM CHLORIDE 0.9 % IV SOLN
INTRAVENOUS | Status: DC
  Administered 2017-05-27: 07:00:00 via INTRAVENOUS

## 2017-05-27 MED ORDER — HEPARIN (PORCINE) IN NACL 2-0.9 UNIT/ML-% IJ SOLN
INTRAMUSCULAR | Status: DC | PRN
  Administered 2017-05-27: 09:00:00

## 2017-05-27 MED ORDER — LIDOCAINE HCL 2 % IJ SOLN
0.1000 mL | Freq: Once | INTRAMUSCULAR | Status: DC
  Filled 2017-05-27: qty 10

## 2017-05-27 MED ORDER — MIDAZOLAM HCL 5 MG/5ML IJ SOLN
INTRAMUSCULAR | Status: AC
  Filled 2017-05-27: qty 5

## 2017-05-27 MED ORDER — MEDROXYPROGESTERONE ACETATE 150 MG/ML IM SUSP: 150.0000 mg | INTRAMUSCULAR | Status: DC

## 2017-05-27 MED ORDER — MIDAZOLAM HCL 5 MG/5ML IJ SOLN
INTRAMUSCULAR | Status: DC | PRN
  Administered 2017-05-27 (×4): 1 mg via INTRAVENOUS

## 2017-05-27 MED ORDER — DULOXETINE HCL 60 MG PO CPEP: 60.0000 mg | ORAL_CAPSULE | Freq: Every day | ORAL | Status: DC

## 2017-05-27 MED ORDER — CYANOCOBALAMIN 1000 MCG/ML IJ SOLN: 1.0000 ug | INTRAMUSCULAR | Status: DC

## 2017-05-27 MED ORDER — ISOPROTERENOL HCL 0.2 MG/ML IJ SOLN
INTRAMUSCULAR | Status: AC
  Filled 2017-05-27: qty 5

## 2017-05-27 MED ORDER — SODIUM CHLORIDE 0.9 % IV SOLN: 250.0000 mL | INTRAVENOUS | Status: DC | PRN

## 2017-05-27 MED ORDER — ISOPROTERENOL HCL 0.2 MG/ML IJ SOLN
INTRAMUSCULAR | Status: DC | PRN
  Administered 2017-05-27: 4 ug/min via INTRAVENOUS

## 2017-05-27 MED ORDER — FENTANYL CITRATE (PF) 100 MCG/2ML IJ SOLN
INTRAMUSCULAR | Status: AC
  Filled 2017-05-27: qty 2

## 2017-05-27 MED ORDER — SODIUM CHLORIDE 0.9% FLUSH: 3.0000 mL | Freq: Two times a day (BID) | INTRAVENOUS | Status: DC

## 2017-05-27 MED ORDER — FENTANYL CITRATE (PF) 100 MCG/2ML IJ SOLN
INTRAMUSCULAR | Status: DC | PRN
  Administered 2017-05-27 (×4): 12.5 ug via INTRAVENOUS

## 2017-05-27 MED ORDER — BUPIVACAINE HCL (PF) 0.25 % IJ SOLN
INTRAMUSCULAR | Status: DC | PRN
  Administered 2017-05-27: 60 mL

## 2017-05-27 MED ORDER — BUPIVACAINE HCL (PF) 0.25 % IJ SOLN
INTRAMUSCULAR | Status: AC
  Filled 2017-05-27: qty 60

## 2017-05-27 MED ORDER — SODIUM CHLORIDE 0.9% FLUSH: 3.0000 mL | INTRAVENOUS | Status: DC | PRN

## 2017-05-27 MED ORDER — ACETAMINOPHEN 325 MG PO TABS: 650.0000 mg | ORAL_TABLET | ORAL | Status: DC | PRN

## 2017-05-27 MED ORDER — IBUPROFEN 400 MG PO TABS: 400.0000 mg | ORAL_TABLET | Freq: Three times a day (TID) | ORAL | Status: DC | PRN

## 2017-05-27 MED ORDER — DILTIAZEM HCL ER COATED BEADS 120 MG PO CP24: 120.0000 mg | ORAL_CAPSULE | Freq: Every day | ORAL | Status: DC

## 2017-05-27 MED ORDER — ONDANSETRON HCL 4 MG/2ML IJ SOLN: 4.0000 mg | Freq: Four times a day (QID) | INTRAMUSCULAR | Status: DC | PRN

## 2017-05-27 SURGICAL SUPPLY — 12 items
BAG SNAP BAND KOVER 36X36 (MISCELLANEOUS) ×3 IMPLANT
CATH EZ STEER NAV 4MM D-F CUR (ABLATOR) ×3 IMPLANT
CATH HEX JOS 2-5-2 65CM 6F REP (CATHETERS) ×3 IMPLANT
CATH JOSEPHSON QUAD-ALLRED 6FR (CATHETERS) ×6 IMPLANT
PACK EP LATEX FREE (CUSTOM PROCEDURE TRAY) ×2
PACK EP LF (CUSTOM PROCEDURE TRAY) ×1 IMPLANT
PAD DEFIB LIFELINK (PAD) ×3 IMPLANT
PATCH CARTO3 (PAD) ×3 IMPLANT
SHEATH PINNACLE 6F 10CM (SHEATH) ×6 IMPLANT
SHEATH PINNACLE 7F 10CM (SHEATH) ×3 IMPLANT
SHEATH PINNACLE 8F 10CM (SHEATH) ×3 IMPLANT
SHIELD RADPAD SCOOP 12X17 (MISCELLANEOUS) ×3 IMPLANT

## 2017-05-27 NOTE — Progress Notes (Signed)
Site area: Right Internal Jugular a 7 french venous sheath was removed  Site Prior to Removal:  Level 0  Pressure Applied For 10 MINUTES    Bedrest Beginning at   Manual:   Yes.    Patient Status During Pull:  stable  Post Pull Groin Site:  Level 0  Post Pull Instructions Given:  Yes.    Post Pull Pulses Present:  Yes.    Dressing Applied:  Yes.    Comments:  VS remain stable

## 2017-05-27 NOTE — Discharge Instructions (Signed)
Cardiac Ablation Cardiac ablation is a procedure to stop some heart tissue from causing problems. The heart has many electrical connections. Sometimes these connections make the heart beat very fast or irregularly. Removing some problem areas can improve the heart rhythm or make it normal. What happens before the procedure?  Follow instructions from your doctor about what you cannot eat or drink.  Ask your doctor about: ? Changing or stopping your normal medicines. This is important if you take diabetes medicines or blood thinners. ? Taking medicines such as aspirin and ibuprofen. These medicines can thin your blood. Do not take these medicines before your procedure if your doctor tells you not to.  Plan to have someone take you home.  If you will be going home right after the procedure, plan to have someone with you for 24 hours. What happens during the procedure?  To lower your risk of infection: ? Your health care team will wash or sanitize their hands. ? Your skin will be washed with soap. ? Hair may be removed from your neck or groin.  An IV tube will be put into one of your veins.  You will be given a medicine to help you relax (sedative).  Skin on your neck or groin will be numbed.  A cut (incision) will be made in your neck or groin.  A needle will be put through your cut and into a vein in your neck or groin.  A tube (catheter) will be put into the needle. The tube will be moved to your heart. X-rays (fluoroscopy) will be used to help guide the tube.  Small devices (electrodes) on the tip of the tube will send out electrical currents.  Dye may be put through the tube. This helps your surgeon see your heart.  Electrical energy will be used to scar (ablate) some heart tissue. Your surgeon may use: ? Heat (radiofrequency energy). ? Laser energy. ? Extreme cold (cryoablation).  The tube will be taken out.  Pressure will be held on your cut. This helps stop  bleeding.  A bandage (dressing) will be put on your cut. The procedure may vary. What happens after the procedure?  You will be monitored until your medicines have worn off.  Your cut will be watched for bleeding. You will need to lie still for a few hours.  Do not drive for 24 hours or as long as your doctor tells you. Summary  Cardiac ablation is a procedure to stop some heart tissue from causing problems.  Electrical energy will be used to scar (ablate) some heart tissue. This information is not intended to replace advice given to you by your health care provider. Make sure you discuss any questions you have with your health care provider. Document Released: 08/03/2013 Document Revised: 10/20/2016 Document Reviewed: 10/20/2016 Elsevier Interactive Patient Education  2017 [place]. Femoral Site Care Refer to this sheet in the next few weeks. These instructions provide you with information about caring for yourself after your procedure. Your health care provider may also give you more specific instructions. Your treatment has been planned according to current medical practices, but problems sometimes occur. Call your health care provider if you have any problems or questions after your procedure. What can I expect after the procedure? After your procedure, it is typical to have the following:  Bruising at the site that usually fades within 1-2 weeks.  Blood collecting in the tissue (hematoma) that may be painful to the touch. It should usually decrease  in size and tenderness within 1-2 weeks.  Follow these instructions at home:  Take medicines only as directed by your health care provider.  You may shower 24-48 hours after the procedure or as directed by your health care provider. Remove the bandage (dressing) and gently wash the site with plain soap and water. Pat the area dry with a clean towel. Do not rub the site, because this may cause bleeding.  Do not take baths, swim,  or use a hot tub until your health care provider approves.  Check your insertion site every day for redness, swelling, or drainage.  Do not apply powder or lotion to the site.  Limit use of stairs to twice a day for the first 2-3 days or as directed by your health care provider.  Do not squat for the first 2-3 days or as directed by your health care provider.  Do not lift over 10 lb (4.5 kg) for 5 days after your procedure or as directed by your health care provider.  Ask your health care provider when it is okay to: ? Return to work or school. ? Resume usual physical activities or sports. ? Resume sexual activity.  Do not drive home if you are discharged the same day as the procedure. Have someone else drive you.  You may drive 24 hours after the procedure unless otherwise instructed by your health care provider.  Do not operate machinery or power tools for 24 hours after the procedure or as directed by your health care provider.  If your procedure was done as an outpatient procedure, which means that you went home the same day as your procedure, a responsible adult should be with you for the first 24 hours after you arrive home.  Keep all follow-up visits as directed by your health care provider. This is important. Contact a health care provider if:  You have a fever.  You have chills.  You have increased bleeding from the site. Hold pressure on the site. Get help right away if:  You have unusual pain at the site.  You have redness, warmth, or swelling at the site.  You have drainage (other than a small amount of blood on the dressing) from the site.  The site is bleeding, and the bleeding does not stop after 30 minutes of holding steady pressure on the site.  Your leg or foot becomes pale, cool, tingly, or numb. This information is not intended to replace advice given to you by your health care provider. Make sure you discuss any questions you have with your health care  provider. Document Released: 08/04/2014 Document Revised: 05/08/2016 Document Reviewed: 06/20/2014 Elsevier Interactive Patient Education  [employer].

## 2017-05-27 NOTE — H&P (Signed)
HPI Sarah Holmes returns today for followup. She is a pleasant 46 yo woman with a h/o SVT who underwent catheter ablation and was found to have 3 different atrial tachycardia foci, all successfully ablated at 11, 9, and 7 o'clock on the tricuspid valve annulus. In the interim she hasd done well until a week ago when she was awakened from sleep with recurrent palpitations and went to the ED where she was found to be in a narrow complex tachycardia at 173/min. The patient was treated with 2 rounds of IV adenosine and her SVT resolved.  No Known Allergies         Current Outpatient Prescriptions  Medication Sig Dispense Refill  . cyanocobalamin (,VITAMIN B-12,) 1000 MCG/ML injection Inject 1 mcg into the muscle every 30 (thirty) days.    Marland Kitchen diltiazem (CARDIZEM CD) 120 MG 24 hr capsule Take 1 capsule (120 mg total) by mouth daily. 90 capsule 3  . DULoxetine (CYMBALTA) 60 MG capsule Take 60 mg by mouth daily.    . medroxyPROGESTERone (DEPO-PROVERA) 150 MG/ML injection Inject 150 mg into the muscle every 3 (three) months.    . ranitidine (ZANTAC) 150 MG tablet Take 150 mg by mouth at bedtime.    . topiramate (TOPAMAX) 25 MG tablet Take 1 tablet by mouth 2 (two) times daily.     No current facility-administered medications for this visit.      Past Medical History:  Diagnosis Date  . Abnormal Pap smear 08/28/09   lgsil  . Abnormal Pap smear 01/29/09   lgsil  . Abnormal uterine bleeding    CONTROLLED W/DEPO PROVERA  . Anxiety   . High triglycerides   . Psoriasis   . Sleep difficulties   . SVT (supraventricular tachycardia) (HCC)     ROS:   All systems reviewed and negative except as noted in the HPI.        Past Surgical History:  Procedure Laterality Date  . BREAST ENHANCEMENT SURGERY  1998   Dr. Wendy Poet  . ELECTROPHYSIOLOGIC STUDY N/A 10/18/2015   Procedure: SVT Ablation;  Surgeon: Evans Lance, MD;  Location: [place] [facility];   Service: Cardiovascular;  Laterality: N/A;  . ENDOMETRIAL BIOPSY  03/06/09   low grade squamous CIN1           Family History  Problem Relation Age of Onset  . Diabetes Mother   . Hypertension Mother   . Cancer Father 24    LUNG   . CVA Father   . Cancer Maternal Grandmother     PANCREATIC  . Cancer Maternal Grandfather     LUNG  . Ovarian cancer Maternal Grandfather   . Diabetes Paternal Grandmother      Social History        Social History  . Marital status: Married    Spouse name: N/A  . Number of children: N/A  . Years of education: N/A      Occupational History  . Not on file.         Social History Main Topics  . Smoking status: Current Every Day Smoker    Packs/day: 0.50  . Smokeless tobacco: Never Used  . Alcohol use Yes     Comment: rare  . Drug use: No  . Sexual activity: Yes    Partners: Male    Birth control/ protection: None       Other Topics Concern  . Not on file  Social History Narrative  . No narrative on file     BP 110/76   Pulse 86   Ht 5' 5.5" (1.664 m)   Wt 166 lb 12.8 oz (75.7 kg)   SpO2 98%   BMI 27.33 kg/m   Physical Exam:  Well appearing middle aged woman, NAD HEENT: Unremarkable Neck:  6 cm JVD, no thyromegally Lymphatics:  No adenopathy Back:  No CVA tenderness Lungs:  Clear with no wheezes HEART:  Regular rate rhythm, no murmurs, no rubs, no clicks Abd:  soft, positive bowel sounds, no organomegally, no rebound, no guarding Ext:  2 plus pulses, no edema, no cyanosis, no clubbing Skin:  No rashes no nodules Neuro:  CN II through XII intact, motor grossly intact  EKG -  nsr with borderline LAE   Assess/Plan: 1. SVT - it is not surprising that she has had more SVT with her clinical history. However, the current SVT looks like AVNRT and stopped with adenosine which would be less likely with atrial tachycardia. I gave her 3 options for treatment. One watchful  waiting. Two, repeat catheter ablation, and 3, AA drug therapy with a sodium channel blocking drug. She is considering her options and would like to proceed with ablation. 2. Tobacco abuse - I have encouraged smoking cessation.   Sarah Holmes.D.

## 2017-05-27 NOTE — Progress Notes (Signed)
Site area: Right groin a 6 french sheaths X2, and 8 french venous sheath was removed  Site Prior to Removal:  Level 0  Pressure Applied For 20 MINUTES    Bedrest Beginning at 1100am  Manual:   Yes.    Patient Status During Pull:  stable  Post Pull Groin Site:  Level 0  Post Pull Instructions Given:  Yes.    Post Pull Pulses Present:  Yes.    Dressing Applied:  Yes.    Comments:  VS remain stable during sheath

## 2017-05-28 ENCOUNTER — Encounter (HOSPITAL_COMMUNITY): Payer: Self-pay | Admitting: Internal Medicine

## 2017-05-29 ENCOUNTER — Emergency Department: Payer: BLUE CROSS/BLUE SHIELD

## 2017-05-29 ENCOUNTER — Observation Stay
Admission: EM | Admit: 2017-05-29 | Discharge: 2017-05-30 | Disposition: A | Discharge status: Home / Self Care | Payer: BLUE CROSS/BLUE SHIELD | Attending: Internal Medicine | Admitting: Internal Medicine

## 2017-05-29 DIAGNOSIS — F17210 Nicotine dependence, cigarettes, uncomplicated: Secondary | ICD-10-CM | POA: Insufficient documentation

## 2017-05-29 DIAGNOSIS — K219 Gastro-esophageal reflux disease without esophagitis: Secondary | ICD-10-CM | POA: Insufficient documentation

## 2017-05-29 DIAGNOSIS — R509 Fever, unspecified: Secondary | ICD-10-CM | POA: Diagnosis not present

## 2017-05-29 DIAGNOSIS — I471 Supraventricular tachycardia: Secondary | ICD-10-CM | POA: Insufficient documentation

## 2017-05-29 DIAGNOSIS — R000 Tachycardia, unspecified: Secondary | ICD-10-CM | POA: Diagnosis not present

## 2017-05-29 DIAGNOSIS — R748 Abnormal levels of other serum enzymes: Principal | ICD-10-CM | POA: Insufficient documentation

## 2017-05-29 DIAGNOSIS — R7989 Other specified abnormal findings of blood chemistry: Secondary | ICD-10-CM

## 2017-05-29 DIAGNOSIS — Z720 Tobacco use: Secondary | ICD-10-CM | POA: Diagnosis present

## 2017-05-29 DIAGNOSIS — F411 Generalized anxiety disorder: Secondary | ICD-10-CM | POA: Diagnosis not present

## 2017-05-29 DIAGNOSIS — Z79899 Other long term (current) drug therapy: Secondary | ICD-10-CM | POA: Insufficient documentation

## 2017-05-29 DIAGNOSIS — F419 Anxiety disorder, unspecified: Secondary | ICD-10-CM | POA: Diagnosis not present

## 2017-05-29 DIAGNOSIS — J209 Acute bronchitis, unspecified: Secondary | ICD-10-CM | POA: Insufficient documentation

## 2017-05-29 DIAGNOSIS — R778 Other specified abnormalities of plasma proteins: Secondary | ICD-10-CM | POA: Diagnosis present

## 2017-05-29 LAB — CBC
HCT: 42.8 % (ref 35.0–47.0)
Hemoglobin: 15.7 g/dL (ref 12.0–16.0)
MCH: 37.8 pg — AB (ref 26.0–34.0)
MCHC: 36.7 g/dL — ABNORMAL HIGH (ref 32.0–36.0)
MCV: 103.2 fL — AB (ref 80.0–100.0)
PLATELETS: 177 10*3/uL (ref 150–440)
RBC: 4.15 MIL/uL (ref 3.80–5.20)
RDW: 13.2 % (ref 11.5–14.5)
WBC: 7.7 10*3/uL (ref 3.6–11.0)

## 2017-05-29 LAB — COMPREHENSIVE METABOLIC PANEL
ALT: 13 U/L — ABNORMAL LOW (ref 14–54)
ANION GAP: 9 (ref 5–15)
AST: 21 U/L (ref 15–41)
Albumin: 4.1 g/dL (ref 3.5–5.0)
Alkaline Phosphatase: 106 U/L (ref 38–126)
BILIRUBIN TOTAL: 0.8 mg/dL (ref 0.3–1.2)
BUN: 9 mg/dL (ref 6–20)
CHLORIDE: 106 mmol/L (ref 101–111)
CO2: 22 mmol/L (ref 22–32)
Calcium: 9.1 mg/dL (ref 8.9–10.3)
Creatinine, Ser: 0.77 mg/dL (ref 0.44–1.00)
GFR calc Af Amer: >60 mL/min (ref >60)
Glucose, Bld: 129 mg/dL — ABNORMAL HIGH (ref 65–99)
POTASSIUM: 3.3 mmol/L — AB (ref 3.5–5.1)
Sodium: 137 mmol/L (ref 135–145)
TOTAL PROTEIN: 7.2 g/dL (ref 6.5–8.1)

## 2017-05-29 LAB — TROPONIN I
TROPONIN I: 0.08 ng/mL — AB (ref <0.03)
Troponin I: 0.07 ng/mL (ref <0.03)

## 2017-05-29 MED ORDER — ACETAMINOPHEN 325 MG PO TABS: 650.0000 mg | ORAL_TABLET | Freq: Four times a day (QID) | ORAL | Status: DC | PRN

## 2017-05-29 MED ORDER — AZITHROMYCIN 250 MG PO TABS
500.0000 mg | ORAL_TABLET | Freq: Every day | ORAL | Status: DC
  Administered 2017-05-30: 500 mg via ORAL
  Filled 2017-05-29: qty 2

## 2017-05-29 MED ORDER — FAMOTIDINE 20 MG PO TABS
20.0000 mg | ORAL_TABLET | Freq: Every day | ORAL | Status: DC
  Administered 2017-05-29: 20 mg via ORAL
  Filled 2017-05-29: qty 1

## 2017-05-29 MED ORDER — DIPHENHYDRAMINE HCL 25 MG PO CAPS
ORAL_CAPSULE | ORAL | Status: AC
  Administered 2017-05-29: 25 mg via ORAL
  Filled 2017-05-29: qty 1

## 2017-05-29 MED ORDER — DIPHENHYDRAMINE HCL 25 MG PO CAPS
25.0000 mg | ORAL_CAPSULE | Freq: Three times a day (TID) | ORAL | Status: DC | PRN
  Administered 2017-05-29: 25 mg via ORAL

## 2017-05-29 MED ORDER — ONDANSETRON HCL 4 MG PO TABS: 4.0000 mg | ORAL_TABLET | Freq: Four times a day (QID) | ORAL | Status: DC | PRN

## 2017-05-29 MED ORDER — ONDANSETRON HCL 4 MG/2ML IJ SOLN: 4.0000 mg | Freq: Four times a day (QID) | INTRAMUSCULAR | Status: DC | PRN

## 2017-05-29 MED ORDER — ENOXAPARIN SODIUM 40 MG/0.4ML ~~LOC~~ SOLN: 40.0000 mg | SUBCUTANEOUS | Status: DC

## 2017-05-29 MED ORDER — ACETAMINOPHEN 650 MG RE SUPP
650.0000 mg | Freq: Four times a day (QID) | RECTAL | Status: DC | PRN
Start: 2017-05-29 — End: 2017-05-30

## 2017-05-29 MED ORDER — DILTIAZEM HCL ER COATED BEADS 120 MG PO CP24
120.0000 mg | ORAL_CAPSULE | Freq: Every day | ORAL | Status: DC
  Administered 2017-05-29: 120 mg via ORAL
  Filled 2017-05-29: qty 1

## 2017-05-29 MED ORDER — VANCOMYCIN HCL IN DEXTROSE 1-5 GM/200ML-% IV SOLN
1000.0000 mg | Freq: Once | INTRAVENOUS | Status: AC
  Administered 2017-05-29: 1000 mg via INTRAVENOUS
  Filled 2017-05-29: qty 200

## 2017-05-29 MED ORDER — DULOXETINE HCL 30 MG PO CPEP
60.0000 mg | ORAL_CAPSULE | Freq: Every day | ORAL | Status: DC
  Administered 2017-05-29: 60 mg via ORAL
  Filled 2017-05-29: qty 2

## 2017-05-29 NOTE — ED Provider Notes (Signed)
[facility] Emergency Department Provider Note   ____________________________________________   I have reviewed the triage vital signs and the nursing notes.   HISTORY  Chief Complaint Fever and Tachycardia   History limited by: Not Limited   HPI Sarah Holmes is a 46 y.o. female who presents to the emergency department today because of concerns for fever. The patient states that she has felt unwell all day. She is laid in bed. It has been constant. This evening she took a temperature measured 102. the patient recently underwent cardiac ablation for SVT. She has not noticed any swelling or pain around the insertion sites. No chest pain or shortness of breath. No dysuria.   Past Medical History:  Diagnosis Date  . Abnormal Pap smear 08/28/09   lgsil  . Abnormal Pap smear 01/29/09   lgsil  . Abnormal uterine bleeding    CONTROLLED W/DEPO PROVERA  . Anxiety   . High triglycerides   . Psoriasis   . Sleep difficulties   . SVT (supraventricular tachycardia) [facility])     Patient Active Problem List   Diagnosis Date Noted  . SVT (supraventricular tachycardia) ([place]) 10/18/2015  . Psoriasis 10/10/2015  . Supraventricular tachycardia ([place]) 10/10/2015  . Current tobacco use 10/10/2015  . Anxiety, generalized 09/03/2015  . Smoking 1/2 pack a day or less 03/10/2012    Past Surgical History:  Procedure Laterality Date  . BREAST ENHANCEMENT SURGERY  1998   Dr. Wendy Poet  . ELECTROPHYSIOLOGIC STUDY N/A 10/18/2015   Procedure: SVT Ablation;  Surgeon: Evans Lance, MD;  Location: [place] [facility];  Service: Cardiovascular;  Laterality: N/A;  . ENDOMETRIAL BIOPSY  03/06/09   low grade squamous CIN1  . SVT ABLATION N/A 05/27/2017   Procedure: SVT Ablation;  Surgeon: Evans Lance, MD;  Location: [place] [facility];  Service: Cardiovascular;  Laterality: N/A;    Prior to Admission medications   Medication Sig Start Date End Date Taking? Authorizing Provider   cyanocobalamin (,VITAMIN B-12,) 1000 MCG/ML injection Inject 1 mcg into the muscle every 30 (thirty) days. 09/03/15   [provider]  diltiazem (CARDIZEM CD) 120 MG 24 hr capsule Take 1 capsule (120 mg total) by mouth daily. Patient taking differently: Take 120 mg by mouth at bedtime.  01/18/16   Evans Lance, MD  DULoxetine (CYMBALTA) 60 MG capsule Take 60 mg by mouth at bedtime.  04/09/15   [provider]  ibuprofen (ADVIL,MOTRIN) 200 MG tablet Take 400 mg by mouth every 8 (eight) hours as needed (for pain.).    [provider]  medroxyPROGESTERone (DEPO-PROVERA) 150 MG/ML injection Inject 150 mg into the muscle every 3 (three) months.    [provider]  ranitidine (ZANTAC) 150 MG tablet Take 150 mg by mouth at bedtime.    [provider]    Allergies Patient has no known allergies.  Family History  Problem Relation Age of Onset  . Diabetes Mother   . Hypertension Mother   . Cancer Father 22       LUNG   . CVA Father   . Cancer Maternal Grandmother        PANCREATIC  . Cancer Maternal Grandfather        LUNG  . Ovarian cancer Maternal Grandfather   . Diabetes Paternal Grandmother     Social History Social History  Substance Use Topics  . Smoking status: Current Every Day Smoker    Packs/day: 0.50  . Smokeless tobacco: Never  Used  . Alcohol use Yes     Comment: rare    Review of Systems Constitutional: Positive for fever. Eyes: No visual changes. ENT: No sore throat. Cardiovascular: Denies chest pain. Respiratory: Denies shortness of breath. Gastrointestinal: No abdominal pain.  No nausea, no vomiting.  No diarrhea.   Genitourinary: Negative for dysuria. Musculoskeletal: Negative for back pain. Skin: Negative for rash. Neurological: Negative for headaches, focal weakness or numbness.  ____________________________________________   PHYSICAL EXAM:  VITAL SIGNS: ED Triage Vitals  Enc Vitals Group     BP 05/29/17  1954 131/81     Pulse Rate 05/29/17 1954 (!) 115     Resp 05/29/17 1954 15     Temp 05/29/17 1954 99.1 F (37.3 C)     Temp Source 05/29/17 1954 Oral     SpO2 05/29/17 1954 95 %     Weight 05/29/17 1951 165 lb (74.8 kg)     Height 05/29/17 1951 5\' 5"  (1.651 m)    Constitutional: Alert and oriented. Well appearing and in no distress. Eyes: Conjunctivae are normal.  ENT   Head: Normocephalic and atraumatic.   Nose: No congestion/rhinnorhea.   Mouth/Throat: Mucous membranes are moist.   Neck: No stridor. Hematological/Lymphatic/Immunilogical: No cervical lymphadenopathy. Cardiovascular: Normal rate, regular rhythm.  No murmurs, rubs, or gallops.  Respiratory: Normal respiratory effort without tachypnea nor retractions. Breath sounds are clear and equal bilaterally. No wheezes/rales/rhonchi. Gastrointestinal: Soft and non tender. No rebound. No guarding.  Genitourinary: Deferred Musculoskeletal: Normal range of motion in all extremities. No lower extremity edema. Neurologic:  Normal speech and language. No gross focal neurologic deficits are appreciated.  Skin:  Incision sites without erythema, swelling or drainage.  Psychiatric: Mood and affect are normal. Speech and behavior are normal. Patient exhibits appropriate insight and judgment.  ____________________________________________    LABS (pertinent positives/negatives)  Labs Reviewed  CBC - Abnormal; Notable for the following:       Result Value   MCV 103.2 (*)    MCH 37.8 (*)    MCHC 36.7 (*)    All other components within normal limits  TROPONIN I - Abnormal; Notable for the following:    Troponin I 0.08 (*)    All other components within normal limits  COMPREHENSIVE METABOLIC PANEL - Abnormal; Notable for the following:    Potassium 3.3 (*)    Glucose, Bld 129 (*)    ALT 13 (*)    All other components within normal limits  TROPONIN I - Abnormal; Notable for the following:    Troponin I 0.07 (*)    All  other components within normal limits  CULTURE, BLOOD (ROUTINE X 2)  CULTURE, BLOOD (ROUTINE X 2)  HIV ANTIBODY (ROUTINE TESTING)  TROPONIN I  TROPONIN I  BASIC METABOLIC PANEL  CBC     ____________________________________________   EKG  I, Nance Pear, attending physician, personally viewed and interpreted this EKG  EKG Time: 1952 Rate: 112 Rhythm: sinus tachycardia Axis: normal Intervals: qtc 436 QRS: narrow ST changes: no st elevation Impression: abnormal ekg   ____________________________________________    RADIOLOGY  CXR IMPRESSION:  Diffuse interstitial opacities suggestive of airway disease or  bronchial inflammation. No focal pulmonary infiltrate is seen.    ____________________________________________   PROCEDURES  Procedures  CRITICAL CARE Performed by: Nance Pear   Total critical care time: 20 minutes  Critical care time was exclusive of separately billable procedures and treating other patients.  Critical care was necessary to treat or prevent imminent or  life-threatening deterioration.  Critical care was time spent personally by me on the following activities: development of treatment plan with patient and/or surrogate as well as nursing, discussions with consultants, evaluation of patient's response to treatment, examination of patient, obtaining history from patient or surrogate, ordering and performing treatments and interventions, ordering and review of laboratory studies, ordering and review of radiographic studies, pulse oximetry and re-evaluation of patient's condition.  ____________________________________________   INITIAL IMPRESSION / ASSESSMENT AND PLAN / ED COURSE  Pertinent labs & imaging results that were available during my care of the patient were reviewed by me and considered in my medical decision making (see chart for details).  Patient presented to the emergency department today because of concerns for fever.  Patient recently underwent cardiac ablation. Patient's troponin was elevated 0.08. Will plan on admission to hospital service.  ____________________________________________   FINAL CLINICAL IMPRESSION(S) / ED DIAGNOSES  Final diagnoses:  Fever, unspecified  Elevated troponin     Note: This dictation was prepared with Dragon dictation. Any transcriptional errors that result from this process are unintentional     Nance Pear, MD 05/30/17 [phone]

## 2017-05-29 NOTE — ED Notes (Signed)
Pt is afebrile upon arrival to ED and HR is back to normal after pt reports tachycardia at home. Pt reports "feelign better than when she was at home." Skin is not warm and color is WNL. Pt is alert and oriented and dose not present altered in any way. Family at bedside.

## 2017-05-29 NOTE — ED Triage Notes (Signed)
Patient had cardiac ablation 2 days ago. Began with to have fever today with nausea. 102.3 at home.

## 2017-05-29 NOTE — H&P (Signed)
[facility] [place] NAME: Sarah Holmes    MR#:  [id number]  DATE OF BIRTH:  [birth year]  DATE OF ADMISSION:  05/29/2017   PRIMARY CARE PHYSICIAN: Kirk Ruths, MD   REQUESTING/REFERRING PHYSICIAN: Archie Balboa, MD  CHIEF COMPLAINT:   Chief Complaint  Patient presents with  . Fever  . Tachycardia    HISTORY OF PRESENT ILLNESS:  Sarah Holmes  is a 46 y.o. female who presents with fever, malaise.  Patient had cardiac ablation for SVT 2 days ago, and felt ok after procedure, but then today has felt 'bad' all day long.  She checked her temp at home and had a fever to 102 F.  She is a long-time smoker, and states that she has had a chronic smoker's cough. Here in the ED her labs were largely within normal limits, except for troponin elevated at 0.08, and chest x-ray which showed some interstitial changes of uncertain etiology. Her cardiologist was contacted by the ED and recommended admission for overnight observation given her troponin, just to be sure it is trending in the right direction. Hospitalists were called for admission  PAST MEDICAL HISTORY:   Past Medical History:  Diagnosis Date  . Abnormal Pap smear 08/28/09   lgsil  . Abnormal Pap smear 01/29/09   lgsil  . Abnormal uterine bleeding    CONTROLLED W/DEPO PROVERA  . Anxiety   . High triglycerides   . Psoriasis   . Sleep difficulties   . SVT (supraventricular tachycardia) ([place])     PAST SURGICAL HISTORY:   Past Surgical History:  Procedure Laterality Date  . BREAST ENHANCEMENT SURGERY  1998   Dr. Wendy Poet  . ELECTROPHYSIOLOGIC STUDY N/A 10/18/2015   Procedure: SVT Ablation;  Surgeon: Evans Lance, MD;  Location: [place] [facility];  Service: Cardiovascular;  Laterality: N/A;  . ENDOMETRIAL BIOPSY  03/06/09   low grade squamous CIN1  . SVT ABLATION N/A 05/27/2017   Procedure: SVT Ablation;  Surgeon: Evans Lance, MD;  Location: [place] [facility];  Service:  Cardiovascular;  Laterality: N/A;    SOCIAL HISTORY:   Social History  Substance Use Topics  . Smoking status: Current Every Day Smoker    Packs/day: 0.50  . Smokeless tobacco: Never Used  . Alcohol use Yes     Comment: rare    FAMILY HISTORY:   Family History  Problem Relation Age of Onset  . Diabetes Mother   . Hypertension Mother   . Cancer Father 39       LUNG   . CVA Father   . Cancer Maternal Grandmother        PANCREATIC  . Cancer Maternal Grandfather        LUNG  . Ovarian cancer Maternal Grandfather   . Diabetes Paternal Grandmother     DRUG ALLERGIES:  No Known Allergies  MEDICATIONS AT HOME:   Prior to Admission medications   Medication Sig Start Date End Date Taking? Authorizing Provider  cyanocobalamin (,VITAMIN B-12,) 1000 MCG/ML injection Inject 1 mcg into the muscle every 30 (thirty) days. 09/03/15  Yes [provider]  diltiazem (CARDIZEM CD) 120 MG 24 hr capsule Take 1 capsule (120 mg total) by mouth daily. Patient taking differently: Take 120 mg by mouth at bedtime.  01/18/16  Yes Evans Lance, MD  DULoxetine (CYMBALTA) 60 MG capsule Take 60 mg by mouth at bedtime.  04/09/15  Yes [provider]  ibuprofen (ADVIL,MOTRIN)  200 MG tablet Take 400 mg by mouth every 8 (eight) hours as needed (for pain.).   Yes [provider]  medroxyPROGESTERone (DEPO-PROVERA) 150 MG/ML injection Inject 150 mg into the muscle every 3 (three) months.   Yes [provider]  ranitidine (ZANTAC) 150 MG tablet Take 150 mg by mouth at bedtime.   Yes [provider]    REVIEW OF SYSTEMS:  Review of Systems  Constitutional: Positive for fever and malaise/fatigue. Negative for chills and weight loss.  HENT: Negative for ear pain, hearing loss and tinnitus.   Eyes: Negative for blurred vision, double vision, pain and redness.  Respiratory: Negative for cough, hemoptysis and shortness of breath.   Cardiovascular: Negative for chest  pain, palpitations, orthopnea and leg swelling.  Gastrointestinal: Negative for abdominal pain, constipation, diarrhea, nausea and vomiting.  Genitourinary: Negative for dysuria, frequency and hematuria.  Musculoskeletal: Negative for back pain, joint pain and neck pain.  Skin:       No acne, rash, or lesions  Neurological: Negative for dizziness, tremors, focal weakness and weakness.  Endo/Heme/Allergies: Negative for polydipsia. Does not bruise/bleed easily.  Psychiatric/Behavioral: Negative for depression. The patient is not nervous/anxious and does not have insomnia.      VITAL SIGNS:   Vitals:   05/29/17 1951 05/29/17 1954 05/29/17 2100 05/29/17 2130  BP:  131/81 127/81 125/81  Pulse:  (!) 115 88 89  Resp:  15 11 (!) 21  Temp:  99.1 F (37.3 C)    TempSrc:  Oral    SpO2:  95% 96% 96%  Weight: 74.8 kg (165 lb)     Height: 5\' 5"  (1.651 m)      Wt Readings from Last 3 Encounters:  05/29/17 74.8 kg (165 lb)  05/27/17 74.8 kg (165 lb)  04/21/17 75.7 kg (166 lb 12.8 oz)    PHYSICAL EXAMINATION:  Physical Exam  Vitals reviewed. Constitutional: She is oriented to person, place, and time. She appears well-developed and well-nourished. No distress.  HENT:  Head: Normocephalic and atraumatic.  Mouth/Throat: Oropharynx is clear and moist.  Eyes: Conjunctivae and EOM are normal. Pupils are equal, round, and reactive to light. No scleral icterus.  Neck: Normal range of motion. Neck supple. No JVD present. No thyromegaly present.  Cardiovascular: Normal rate, regular rhythm and intact distal pulses.  Exam reveals no gallop and no friction rub.   No murmur heard. Respiratory: Effort normal and breath sounds normal. No respiratory distress. She has no wheezes. She has no rales.  GI: Soft. Bowel sounds are normal. She exhibits no distension. There is no tenderness.  Musculoskeletal: Normal range of motion. She exhibits no edema.  No arthritis, no gout  Lymphadenopathy:    She has no  cervical adenopathy.  Neurological: She is alert and oriented to person, place, and time. No cranial nerve deficit.  No dysarthria, no aphasia  Skin: Skin is warm and dry. No rash noted. No erythema.  Psychiatric: She has a normal mood and affect. Her behavior is normal. Judgment and thought content normal.    LABORATORY PANEL:   CBC  Recent Labs Lab 05/29/17 2003  WBC 7.7  HGB 15.7  HCT 42.8  PLT 177   ------------------------------------------------------------------------------------------------------------------  Chemistries   Recent Labs Lab 05/29/17 2003  NA 137  K 3.3*  CL 106  CO2 22  GLUCOSE 129*  BUN 9  CREATININE 0.77  CALCIUM 9.1  AST 21  ALT 13*  ALKPHOS 106  BILITOT 0.8   ------------------------------------------------------------------------------------------------------------------  Cardiac Enzymes  Recent Labs Lab 05/29/17 2003  TROPONINI 0.08*   ------------------------------------------------------------------------------------------------------------------  RADIOLOGY:  Dg Chest 2 View  Result Date: 05/29/2017 CLINICAL DATA:  Cardiac ablation, fever EXAM: CHEST  2 VIEW COMPARISON:  Report 12/25/2016 FINDINGS: No focal consolidation or pleural effusion. Diffuse interstitial opacity. Cardiomediastinal silhouette within normal limits. No pneumothorax. IMPRESSION: Diffuse interstitial opacities suggestive of airway disease or bronchial inflammation. No focal pulmonary infiltrate is seen. Electronically Signed   By: Donavan Foil M.D.   On: 05/29/2017 20:42    EKG:   Orders placed or performed during the hospital encounter of 05/29/17  . EKG 12-Lead  . EKG 12-Lead    IMPRESSION AND PLAN:  Principal Problem:   Elevated troponin - this is likely postprocedural troponin elevation, we will trend her cardiac enzymes tonight, get a cardiology consult for the morning. Active Problems:   Fever - uncertain etiology, potentially postprocedural  inflammation, though the timeframe doesn't make much sense given that she felt fine the day after the procedure and then started feeling bad. This is potentially something viral, versus something like bronchitis. We will treat her with azithromycin.   Anxiety, generalized - continue home meds   GERD - home dose H2 blocker All the records are reviewed and case discussed with ED provider. Management plans discussed with the patient and/or family.  DVT PROPHYLAXIS: SubQ lovenox  GI PROPHYLAXIS: H2 blocker  ADMISSION STATUS: Observation  CODE STATUS: Full Code Status History    Date Active Date Inactive Code Status Order ID Comments User Context   05/27/2017 11:30 AM 05/27/2017  8:26 PM Full Code [id number]  Evans Lance, MD Inpatient   10/18/2015 12:51 PM 10/19/2015  3:33 PM Full Code [id number]  Evans Lance, MD Inpatient      TOTAL TIME TAKING CARE OF THIS PATIENT: 40 minutes.   Sarah Holmes [place] 05/29/2017, 9:59 PM  [employer] [facility]  Office  [phone]  CC: Primary care physician; Kirk Ruths, MD  Note:  This document was prepared using Dragon voice recognition software and may include unintentional dictation errors.

## 2017-05-29 NOTE — ED Notes (Signed)
Attempted to call report. RN was reported to be on phone and unable to take report. Secretary reports RN would call this RN as soon as possible.

## 2017-05-30 ENCOUNTER — Other Ambulatory Visit: Payer: Self-pay | Admitting: Cardiovascular Disease

## 2017-05-30 DIAGNOSIS — Z720 Tobacco use: Secondary | ICD-10-CM

## 2017-05-30 DIAGNOSIS — I471 Supraventricular tachycardia: Secondary | ICD-10-CM | POA: Diagnosis not present

## 2017-05-30 DIAGNOSIS — F419 Anxiety disorder, unspecified: Secondary | ICD-10-CM | POA: Diagnosis not present

## 2017-05-30 DIAGNOSIS — R509 Fever, unspecified: Secondary | ICD-10-CM

## 2017-05-30 DIAGNOSIS — R748 Abnormal levels of other serum enzymes: Secondary | ICD-10-CM | POA: Diagnosis not present

## 2017-05-30 DIAGNOSIS — F411 Generalized anxiety disorder: Secondary | ICD-10-CM | POA: Diagnosis not present

## 2017-05-30 DIAGNOSIS — R5381 Other malaise: Secondary | ICD-10-CM

## 2017-05-30 DIAGNOSIS — K219 Gastro-esophageal reflux disease without esophagitis: Secondary | ICD-10-CM | POA: Diagnosis not present

## 2017-05-30 DIAGNOSIS — Z9889 Other specified postprocedural states: Secondary | ICD-10-CM | POA: Diagnosis not present

## 2017-05-30 LAB — CBC
HCT: 43 % (ref 35.0–47.0)
HEMOGLOBIN: 15.3 g/dL (ref 12.0–16.0)
MCH: 36.5 pg — AB (ref 26.0–34.0)
MCHC: 35.6 g/dL (ref 32.0–36.0)
MCV: 102.5 fL — ABNORMAL HIGH (ref 80.0–100.0)
PLATELETS: 170 10*3/uL (ref 150–440)
RBC: 4.2 MIL/uL (ref 3.80–5.20)
RDW: 13.2 % (ref 11.5–14.5)
WBC: 7.5 10*3/uL (ref 3.6–11.0)

## 2017-05-30 LAB — BASIC METABOLIC PANEL
ANION GAP: 6 (ref 5–15)
BUN: 10 mg/dL (ref 6–20)
CALCIUM: 8.5 mg/dL — AB (ref 8.9–10.3)
CO2: 23 mmol/L (ref 22–32)
Chloride: 107 mmol/L (ref 101–111)
Creatinine, Ser: 0.75 mg/dL (ref 0.44–1.00)
GFR calc non Af Amer: >60 mL/min (ref >60)
Glucose, Bld: 110 mg/dL — ABNORMAL HIGH (ref 65–99)
Potassium: 3.4 mmol/L — ABNORMAL LOW (ref 3.5–5.1)
SODIUM: 136 mmol/L (ref 135–145)

## 2017-05-30 LAB — TROPONIN I
TROPONIN I: 0.05 ng/mL — AB (ref <0.03)
Troponin I: 0.05 ng/mL (ref <0.03)

## 2017-05-30 MED ORDER — DILTIAZEM HCL 30 MG PO TABS: 30.0000 mg | ORAL_TABLET | Freq: Three times a day (TID) | ORAL | 3 refills | Status: AC | PRN

## 2017-05-30 MED ORDER — AZITHROMYCIN 250 MG PO TABS: 250.0000 mg | ORAL_TABLET | Freq: Every day | ORAL | 0 refills | Status: AC

## 2017-05-30 NOTE — Consult Note (Signed)
Cardiology Consultation Note  Patient ID: DESHON KOSLOWSKI, MRN: [id number], DOB/AGE: 1971/08/13 46 y.o. Admit date: 05/29/2017   Date of Consult: 05/30/2017 Primary Physician: Kirk Ruths, MD Primary Cardiologist: New to [facility]  Chief Complaint: fever, malaise Reason for Consult: malaise, fever elevated troponin Physician requesting consult: Willis   HPI: 46 y.o. female , smoker h/o SVT who underwent catheter ablation 18 months ago   found to have 3 different atrial tachycardia foci, all successfully ablated at 11, 9, and 7 o'clock on the tricuspid valve annulus. Recurrent SVT,   treated with 2 rounds of IV adenosine repeat ablation 05/27/17 Yesterday with malaise, fever to 102 F per the patient at home.    troponin elevated at 0.07, 0.05, 0.05 chest x-ray which showed some interstitial changes of uncertain etiology.  He currently feels well with no complaints Fever has resolved, malaise has resolved Started on azithromycin in the emergency room by hospitalist service     Past Medical History:  Diagnosis Date  . Abnormal Pap smear 08/28/09   lgsil  . Abnormal Pap smear 01/29/09   lgsil  . Abnormal uterine bleeding    CONTROLLED W/DEPO PROVERA  . Anxiety   . High triglycerides   . Psoriasis   . Sleep difficulties   . SVT (supraventricular tachycardia) (HCC)       Most Recent Cardiac Studies: No new testing    Surgical History:  Past Surgical History:  Procedure Laterality Date  . BREAST ENHANCEMENT SURGERY  1998   Dr. Wendy Poet  . ELECTROPHYSIOLOGIC STUDY N/A 10/18/2015   Procedure: SVT Ablation;  Surgeon: Evans Lance, MD;  Location: [place] [facility];  Service: Cardiovascular;  Laterality: N/A;  . ENDOMETRIAL BIOPSY  03/06/09   low grade squamous CIN1  . SVT ABLATION N/A 05/27/2017   Procedure: SVT Ablation;  Surgeon: Evans Lance, MD;  Location: [place] [facility];  Service: Cardiovascular;  Laterality: N/A;     Home Meds: Prior to Admission  medications   Medication Sig Start Date End Date Taking? Authorizing Provider  cyanocobalamin (,VITAMIN B-12,) 1000 MCG/ML injection Inject 1 mcg into the muscle every 30 (thirty) days. 09/03/15  Yes [provider]  diltiazem (CARDIZEM CD) 120 MG 24 hr capsule Take 1 capsule (120 mg total) by mouth daily. Patient taking differently: Take 120 mg by mouth at bedtime.  01/18/16  Yes Evans Lance, MD  DULoxetine (CYMBALTA) 60 MG capsule Take 60 mg by mouth at bedtime.  04/09/15  Yes [provider]  ibuprofen (ADVIL,MOTRIN) 200 MG tablet Take 400 mg by mouth every 8 (eight) hours as needed (for pain.).   Yes [provider]  medroxyPROGESTERone (DEPO-PROVERA) 150 MG/ML injection Inject 150 mg into the muscle every 3 (three) months.   Yes [provider]  ranitidine (ZANTAC) 150 MG tablet Take 150 mg by mouth at bedtime.   Yes [provider]    Inpatient Medications:  . azithromycin  500 mg Oral Daily  . diltiazem  120 mg Oral QHS  . DULoxetine  60 mg Oral QHS  . enoxaparin (LOVENOX) injection  40 mg Subcutaneous Q24H  . famotidine  20 mg Oral QHS     Allergies:  Allergies  Allergen Reactions  . Vancomycin Itching    Social History   Social History  . Marital status: Married    Spouse name: N/A  . Number of children: N/A  . Years of education: N/A   Occupational History  . Not  on file.   Social History Main Topics  . Smoking status: Current Every Day Smoker    Packs/day: 0.50  . Smokeless tobacco: Never Used  . Alcohol use Yes     Comment: rare  . Drug use: No  . Sexual activity: Yes    Partners: Male    Birth control/ protection: None   Other Topics Concern  . Not on file   Social History Narrative  . No narrative on file     Family History  Problem Relation Age of Onset  . Diabetes Mother   . Hypertension Mother   . Cancer Father 10       LUNG   . CVA Father   . Cancer Maternal Grandmother        PANCREATIC  .  Cancer Maternal Grandfather        LUNG  . Ovarian cancer Maternal Grandfather   . Diabetes Paternal Grandmother      Review of Systems: Review of Systems  Constitutional: Positive for fever and malaise/fatigue.  Respiratory: Negative.   Cardiovascular: Negative.   Gastrointestinal: Negative.   Musculoskeletal: Negative.   Neurological: Negative.   Psychiatric/Behavioral: Negative.   All other systems reviewed and are negative.    Labs: CBC  Recent Labs  05/29/17 2003 05/30/17 0505  WBC 7.7 7.5  HGB 15.7 15.3  HCT 42.8 43.0  MCV 103.2* 102.5*  PLT 177 720   Basic Metabolic Panel  Recent Labs  05/29/17 2003 05/30/17 0505  NA 137 136  K 3.3* 3.4*  CL 106 107  CO2 22 23  GLUCOSE 129* 110*  BUN 9 10  CREATININE 0.77 0.75  CALCIUM 9.1 8.5*   Liver Function Tests  Recent Labs  05/29/17 2003  AST 21  ALT 13*  ALKPHOS 106  BILITOT 0.8  PROT 7.2  ALBUMIN 4.1   No results for input(s): LIPASE, AMYLASE in the last 72 hours. Cardiac Enzymes  Recent Labs  05/29/17 2003 05/29/17 2314 05/30/17 0505  TROPONINI 0.08* 0.07* 0.05*   BNP Invalid input(s): POCBNP D-Dimer No results for input(s): DDIMER in the last 72 hours. Hemoglobin A1C No results for input(s): HGBA1C in the last 72 hours. Fasting Lipid Panel No results for input(s): CHOL, HDL, LDLCALC, TRIG, CHOLHDL, LDLDIRECT in the last 72 hours. Thyroid Function Tests No results for input(s): TSH, T4TOTAL, T3FREE, THYROIDAB in the last 72 hours.  Invalid input(s): FREET3  Radiology/Studies:  Dg Chest 2 View  Result Date: 05/29/2017 CLINICAL DATA:  Cardiac ablation, fever EXAM: CHEST  2 VIEW COMPARISON:  Report 12/25/2016 FINDINGS: No focal consolidation or pleural effusion. Diffuse interstitial opacity. Cardiomediastinal silhouette within normal limits. No pneumothorax. IMPRESSION: Diffuse interstitial opacities suggestive of airway disease or bronchial inflammation. No focal pulmonary infiltrate  is seen. Electronically Signed   By: Donavan Foil M.D.   On: 05/29/2017 20:42    EKG: Personally reviewed by myself Sinus tachycardia rate 112 bpm no significant ST or T-wave changes  EKG lab work, chest x-ray reviewed independently by myself  Weights: Filed Weights   05/29/17 1951  Weight: 165 lb (74.8 kg)     Physical Exam: Blood pressure 112/73, pulse 81, temperature 98.2 F (36.8 C), temperature source Oral, resp. rate 16, height 5\' 5"  (1.651 m), weight 165 lb (74.8 kg), last menstrual period 05/08/2017, SpO2 97 %. Body mass index is 27.46 kg/m. GEN: Well nourished, well developed, in no acute distress.  HEENT: Grossly normal.  Neck: Supple, no JVD, carotid bruits, or masses. Cardiac:  RRR, no murmurs, rubs, or gallops. No clubbing, cyanosis, edema.  Radials/DP/PT 2+ and equal bilaterally.  Respiratory:  Respirations regular and unlabored, clear to auscultation bilaterally. GI: Soft, nontender, nondistended, BS + x 4. MS: no deformity or atrophy. Skin: warm and dry, no rash. Neuro:  Strength and sensation are intact. Psych: AAOx3.  Normal affect.    Assessment and Plan:  46 year old woman with SVT ablation 05/27/2017 Presenting with fever, malaise yesterday, borderline elevated troponin  Elevated troponin -  postprocedural troponin elevation, following ablation Trending down no ischemia workup needed,  this is not a non-STEMI    Fever -  Etiology unclear Started on  Azithromycin by medicine service cbc normal    Anxiety, generalized -  continue home meds    GERD -  home dose H2 blocker    Total encounter time more than 80 minutes  Greater than 50% was spent in counseling and coordination of care with the patient   Signed, Esmond Plants, MD, Ph.D [facility] 05/30/2017

## 2017-05-30 NOTE — Progress Notes (Signed)
Patient is admitted to room 233 with the diagnosis of elevated Troponin. Alert and oriented x 4. Denied any acute pain at this moment. Tele box called to CCMD by Merry Proud NT and Suzzanne Cloud. Skin assessment done with Kristine Garbe RN, noted dry rash  skin patches (Psoriasis per pt ). Patient and the mother haven't  c/o anything. Will continue to monitor.

## 2017-05-31 LAB — HIV ANTIBODY (ROUTINE TESTING W REFLEX): HIV SCREEN 4TH GENERATION: NONREACTIVE

## 2017-06-02 NOTE — Discharge Summary (Signed)
[facility] [place] NAME: Sarah Holmes    MR#:  [id number]  DATE OF BIRTH:  11-Dec-1971  DATE OF ADMISSION:  05/29/2017 ADMITTING PHYSICIAN: Lance Coon, MD  DATE OF DISCHARGE: 05/30/2017  2:24 PM  PRIMARY CARE PHYSICIAN: Kirk Ruths, MD   ADMISSION DIAGNOSIS:  Fever  DISCHARGE DIAGNOSIS:  Principal Problem:   Elevated troponin Active Problems:   Anxiety, generalized   Current tobacco use   Fever   SECONDARY DIAGNOSIS:   Past Medical History:  Diagnosis Date  . Abnormal Pap smear 08/28/09   lgsil  . Abnormal Pap smear 01/29/09   lgsil  . Abnormal uterine bleeding    CONTROLLED W/DEPO PROVERA  . Anxiety   . High triglycerides   . Psoriasis   . Sleep difficulties   . SVT (supraventricular tachycardia) ([place])      ADMITTING HISTORY  HISTORY OF PRESENT ILLNESS:  Sarah Holmes  is a 46 y.o. female who presents with fever, malaise.  Patient had cardiac ablation for SVT 2 days ago, and felt ok after procedure, but then today has felt 'bad' all day long.  She checked her temp at home and had a fever to 102 F.  She is a long-time smoker, and states that she has had a chronic smoker's cough. Here in the ED her labs were largely within normal limits, except for troponin elevated at 0.08, and chest x-ray which showed some interstitial changes of uncertain etiology. Her cardiologist was contacted by the ED and recommended admission for overnight observation given her troponin, just to be sure it is trending in the right direction. Hospitalists were called for admission   HOSPITAL COURSE:   * Acute bronchitis with fever. Patient started on azithromycin. Nebulizers as needed. She did not have any worsening shortness of breath. She does have chronic smoker's cough which is unchanged. Blood cultures remain negative.  * Elevated troponin with acute bronchitis and recent cardiac ablation. This was repeated. No increase in troponin  levels. Seen by cardiology. No further investigations needed. Outpatient follow-up with her electrophysiologist.  Counseled to quit smoking.  * SVT. No episodes of SVT in the hospital. Continue on home medications.  Patient discharged home in stable condition on oral azithromycin.  Advised return to the emergency room if she has any fever, shortness of breath or worsening symptoms.  CONSULTS OBTAINED:  Treatment Team:  Minna Merritts, MD  DRUG ALLERGIES:   Allergies  Allergen Reactions  . Vancomycin Itching    DISCHARGE MEDICATIONS:   Discharge Medication List as of 05/30/2017  2:08 PM    START taking these medications   Details  azithromycin (ZITHROMAX) 250 MG tablet Take 1 tablet (250 mg total) by mouth daily., Starting Sun 05/31/2017, Until Wed 06/03/2017, Normal      CONTINUE these medications which have NOT CHANGED   Details  cyanocobalamin (,VITAMIN B-12,) 1000 MCG/ML injection Inject 1 mcg into the muscle every 30 (thirty) days., Starting 09/03/2015, Until Discontinued, Historical Med    diltiazem (CARDIZEM CD) 120 MG 24 hr capsule Take 1 capsule (120 mg total) by mouth daily., Starting Fri 01/18/2016, Normal    DULoxetine (CYMBALTA) 60 MG capsule Take 60 mg by mouth at bedtime. , Starting Mon 04/09/2015, Historical Med    ibuprofen (ADVIL,MOTRIN) 200 MG tablet Take 400 mg by mouth every 8 (eight) hours as needed (for pain.)., Historical Med    medroxyPROGESTERone (DEPO-PROVERA) 150 MG/ML injection Inject 150 mg into the muscle every  3 (three) months., Until Discontinued, Historical Med    ranitidine (ZANTAC) 150 MG tablet Take 150 mg by mouth at bedtime., Until Discontinued, Historical Med    diltiazem (CARDIZEM) 30 MG tablet Take 1 tablet (30 mg total) by mouth 3 (three) times daily as needed., Starting Sat 05/30/2017, Normal        Today   VITAL SIGNS:  Blood pressure 130/83, pulse 97, temperature 97.6 F (36.4 C), temperature source Oral, resp. rate 14,  height 5\' 5"  (1.651 m), weight 74.8 kg (165 lb), last menstrual period 05/08/2017, SpO2 97 %.  I/O:  No intake or output data in the 24 hours ending 06/02/17 1102  PHYSICAL EXAMINATION:  Physical Exam  GENERAL:  46 y.o.-year-old patient lying in the bed with no acute distress.  LUNGS: Normal breath sounds bilaterally, no wheezing, rales,rhonchi or crepitation. No use of accessory muscles of respiration.  CARDIOVASCULAR: S1, S2 normal. No murmurs, rubs, or gallops.  ABDOMEN: Soft, non-tender, non-distended. Bowel sounds present. No organomegaly or mass.  NEUROLOGIC: Moves all 4 extremities. PSYCHIATRIC: The patient is alert and oriented x 3.  SKIN: No obvious rash, lesion, or ulcer.   DATA REVIEW:   CBC  Recent Labs Lab 05/30/17 0505  WBC 7.5  HGB 15.3  HCT 43.0  PLT 170    Chemistries   Recent Labs Lab 05/29/17 2003 05/30/17 0505  NA 137 136  K 3.3* 3.4*  CL 106 107  CO2 22 23  GLUCOSE 129* 110*  BUN 9 10  CREATININE 0.77 0.75  CALCIUM 9.1 8.5*  AST 21  --   ALT 13*  --   ALKPHOS 106  --   BILITOT 0.8  --     Cardiac Enzymes  Recent Labs Lab 05/30/17 1058  TROPONINI 0.05*    Microbiology Results  Results for orders placed or performed during the hospital encounter of 05/29/17  Blood culture (routine x 2)     Status: None (Preliminary result)   Collection Time: 05/29/17  9:34 PM  Result Value Ref Range Status   Specimen Description BLOOD RIGHT FOREARM  Final   Special Requests   Final    BOTTLES DRAWN AEROBIC AND ANAEROBIC Blood Culture adequate volume   Culture NO GROWTH 4 DAYS  Final   Report Status PENDING  Incomplete  Blood culture (routine x 2)     Status: None (Preliminary result)   Collection Time: 05/29/17  9:34 PM  Result Value Ref Range Status   Specimen Description BLOOD RIGHT ASSIST CONTROL  Final   Special Requests   Final    BOTTLES DRAWN AEROBIC AND ANAEROBIC Blood Culture adequate volume   Culture NO GROWTH 4 DAYS  Final    Report Status PENDING  Incomplete    RADIOLOGY:  No results found.  Follow up with PCP in 1 week.  Management plans discussed with the patient, family and they are in agreement.  CODE STATUS:  Code Status History    Date Active Date Inactive Code Status Order ID Comments User Context   05/29/2017 11:10 PM 05/30/2017  5:29 PM Full Code [id number]  Lance Coon, MD Inpatient   05/27/2017 11:30 AM 05/27/2017  8:26 PM Full Code [id number]  Evans Lance, MD Inpatient   10/18/2015 12:51 PM 10/19/2015  3:33 PM Full Code [id number]  Evans Lance, MD Inpatient      TOTAL TIME TAKING CARE OF THIS PATIENT ON DAY OF DISCHARGE: more than 30 minutes.   Hillary Bow R M.D on 06/02/2017  at 11:02 AM  Between 7am to 6pm - Pager - [phone]  After 6pm go to [email or web address] - password [email or web address] [place] [facility]  Office  [phone]  CC: Primary care physician; Kirk Ruths, MD  Note: This dictation was prepared with Dragon dictation along with smaller phrase technology. Any transcriptional errors that result from this process are unintentional.

## 2017-06-03 LAB — CULTURE, BLOOD (ROUTINE X 2)
Culture: NO GROWTH
Culture: NO GROWTH
SPECIAL REQUESTS: ADEQUATE
Special Requests: ADEQUATE

## 2017-06-05 DIAGNOSIS — I471 Supraventricular tachycardia: Secondary | ICD-10-CM | POA: Diagnosis not present

## 2017-06-05 DIAGNOSIS — Z720 Tobacco use: Secondary | ICD-10-CM | POA: Diagnosis not present

## 2017-06-30 ENCOUNTER — Ambulatory Visit: Payer: BLUE CROSS/BLUE SHIELD | Admitting: Internal Medicine

## 2017-07-01 ENCOUNTER — Encounter: Payer: Self-pay | Admitting: Internal Medicine

## 2017-07-02 ENCOUNTER — Encounter: Payer: Self-pay | Admitting: Internal Medicine

## 2017-07-04 ENCOUNTER — Other Ambulatory Visit: Payer: Self-pay | Admitting: Cardiovascular Disease

## 2017-07-06 NOTE — Telephone Encounter (Signed)
Refill Request.  

## 2018-01-05 DIAGNOSIS — Z79899 Other long term (current) drug therapy: Secondary | ICD-10-CM | POA: Diagnosis not present

## 2018-01-05 DIAGNOSIS — L400 Psoriasis vulgaris: Secondary | ICD-10-CM | POA: Diagnosis not present

## 2018-02-02 DIAGNOSIS — L400 Psoriasis vulgaris: Secondary | ICD-10-CM | POA: Diagnosis not present

## 2018-02-02 DIAGNOSIS — D1801 Hemangioma of skin and subcutaneous tissue: Secondary | ICD-10-CM | POA: Diagnosis not present

## 2018-02-02 DIAGNOSIS — D485 Neoplasm of uncertain behavior of skin: Secondary | ICD-10-CM | POA: Diagnosis not present

## 2018-02-02 DIAGNOSIS — L578 Other skin changes due to chronic exposure to nonionizing radiation: Secondary | ICD-10-CM | POA: Diagnosis not present

## 2018-02-16 DIAGNOSIS — D485 Neoplasm of uncertain behavior of skin: Secondary | ICD-10-CM | POA: Diagnosis not present

## 2018-02-16 DIAGNOSIS — D225 Melanocytic nevi of trunk: Secondary | ICD-10-CM | POA: Diagnosis not present

## 2018-02-18 DIAGNOSIS — B351 Tinea unguium: Secondary | ICD-10-CM | POA: Diagnosis not present

## 2018-03-01 DIAGNOSIS — L080 Pyoderma: Secondary | ICD-10-CM | POA: Diagnosis not present

## 2018-03-02 DIAGNOSIS — B372 Candidiasis of skin and nail: Secondary | ICD-10-CM | POA: Diagnosis not present

## 2018-03-11 DIAGNOSIS — B372 Candidiasis of skin and nail: Secondary | ICD-10-CM | POA: Diagnosis not present

## 2018-03-29 ENCOUNTER — Encounter: Payer: Self-pay | Admitting: Obstetrics and Gynecology

## 2018-03-29 ENCOUNTER — Ambulatory Visit (INDEPENDENT_AMBULATORY_CARE_PROVIDER_SITE_OTHER): Payer: BLUE CROSS/BLUE SHIELD | Admitting: Obstetrics and Gynecology

## 2018-03-29 VITALS — BP 134/84 | Ht 65.0 in | Wt 165.0 lb

## 2018-03-29 DIAGNOSIS — R87612 Low grade squamous intraepithelial lesion on cytologic smear of cervix (LGSIL): Secondary | ICD-10-CM | POA: Diagnosis not present

## 2018-03-29 DIAGNOSIS — N921 Excessive and frequent menstruation with irregular cycle: Secondary | ICD-10-CM | POA: Diagnosis not present

## 2018-03-29 NOTE — Progress Notes (Signed)
Obstetrics & Gynecology Office Visit   Chief Complaint  Patient presents with  . Menstrual Problem    pt wants to discuss surgery    History of Present Illness: 47 y.o. G0P0000 female who presents with frequent menstruation.  Her menses come every two weeks, last about 4-5 days.  There is a lot of associated cramping and she generally feels unwell.  She also passes clots.  This has been present for the past six months.  Prior to this she had infrequent menstruation while on depo Provera, of which she took her last dose in 04/2017.  She had been on Depo Provera for about 10 years prior to that. She was on depo provera for contraception and heavy menses.  She has had no weight changes.  Denies new bloating and constipation.  Denies early satiety.  She notes urinary frequency and occasional incontinence.  She states that she has a quick onset of urgency which is hard to defer and has occasional episodes of incontinence.   She has seen no one for this issue.  She has a history of SVT for which she has undergone ablation. Last pap smear was several years ago and was normal.  Denies vision changes and leakage of milk from her nipples.  No new skin or hair changes.   Past Medical History:  Diagnosis Date  . Abnormal Pap smear 08/28/09   lgsil  . Abnormal Pap smear 01/29/09   lgsil  . Abnormal uterine bleeding    CONTROLLED W/DEPO PROVERA  . Anxiety   . High triglycerides   . Psoriasis   . Sleep difficulties   . SVT (supraventricular tachycardia) (HCC)     Past Surgical History:  Procedure Laterality Date  . BREAST ENHANCEMENT SURGERY  1998   Dr. Wendy Poet  . CARDIAC ELECTROPHYSIOLOGY STUDY AND ABLATION    . ELECTROPHYSIOLOGIC STUDY N/A 10/18/2015   Procedure: SVT Ablation;  Surgeon: Evans Lance, MD;  Location: [place] [facility];  Service: Cardiovascular;  Laterality: N/A;  . ENDOMETRIAL BIOPSY  03/06/09   low grade squamous CIN1  . SVT ABLATION N/A 05/27/2017   Procedure: SVT Ablation;   Surgeon: Evans Lance, MD;  Location: [place] [facility];  Service: Cardiovascular;  Laterality: N/A;    Gynecologic History: Patient's last menstrual period was 03/22/2018.  Obstetric History: G0P0000  Family History  Problem Relation Age of Onset  . Diabetes Mother   . Hypertension Mother   . Cancer Father 85       LUNG   . CVA Father   . Cancer Maternal Grandmother        PANCREATIC  . Cancer Maternal Grandfather        LUNG  . Ovarian cancer Maternal Grandfather   . Diabetes Paternal Grandmother     Social History   Socioeconomic History  . Marital status: Married    Spouse name: Not on file  . Number of children: Not on file  . Years of education: Not on file  . Highest education level: Not on file  Occupational History  . Not on file  Social Needs  . Financial resource strain: Not on file  . Food insecurity:    Worry: Not on file    Inability: Not on file  . Transportation needs:    Medical: Not on file    Non-medical: Not on file  Tobacco Use  . Smoking status: Current Every Day Smoker    Packs/day: 0.50  . Smokeless tobacco: Never Used  Substance and Sexual Activity  . Alcohol use: Yes    Comment: rare  . Drug use: No  . Sexual activity: Yes    Partners: Male    Birth control/protection: None  Lifestyle  . Physical activity:    Days per week: Not on file    Minutes per session: Not on file  . Stress: Not on file  Relationships  . Social connections:    Talks on phone: Not on file    Gets together: Not on file    Attends religious service: Not on file    Active member of club or organization: Not on file    Attends meetings of clubs or organizations: Not on file    Relationship status: Not on file  . Intimate partner violence:    Fear of current or ex partner: Not on file    Emotionally abused: Not on file    Physically abused: Not on file    Forced sexual activity: Not on file  Other Topics Concern  . Not on file  Social History  Narrative  . Not on file    Allergies  Allergen Reactions  . Vancomycin Itching and Rash    Prior to Admission medications   Medication Sig Start Date End Date Taking? Authorizing Provider  cyanocobalamin (,VITAMIN B-12,) 1000 MCG/ML injection Inject 1 mcg into the muscle every 30 (thirty) days. 09/03/15  Yes [provider]  diltiazem (CARDIZEM CD) 120 MG 24 hr capsule Take 1 capsule (120 mg total) by mouth daily. Patient taking differently: Take 120 mg by mouth at bedtime.  01/18/16  Yes Evans Lance, MD  diltiazem (CARDIZEM) 30 MG tablet Take 1 tablet (30 mg total) by mouth 3 (three) times daily as needed. 05/30/17  Yes Gollan, Kathlene November, MD  DULoxetine (CYMBALTA) 60 MG capsule Take 60 mg by mouth at bedtime.  04/09/15  Yes [provider]  ibuprofen (ADVIL,MOTRIN) 200 MG tablet Take 400 mg by mouth every 8 (eight) hours as needed (for pain.).   Yes [provider]  ranitidine (ZANTAC) 150 MG tablet Take 150 mg by mouth at bedtime.   Yes [provider]  medroxyPROGESTERone (DEPO-PROVERA) 150 MG/ML injection Inject 150 mg into the muscle every 3 (three) months.    [provider]    Review of Systems  Constitutional: Negative.   HENT: Negative.   Eyes: Negative.   Respiratory: Negative.   Cardiovascular: Negative.   Gastrointestinal: Negative.   Genitourinary: Positive for urgency. Negative for dysuria, flank pain, frequency and hematuria.       See HPI for gynecologic HPI  Musculoskeletal: Negative.   Skin: Negative.   Neurological: Negative.   Endo/Heme/Allergies: Negative.   Psychiatric/Behavioral: Negative.      Physical Exam BP 134/84   Ht 5\' 5"  (1.651 m)   Wt 165 lb (74.8 kg)   LMP 03/22/2018   BMI 27.46 kg/m  Patient's last menstrual period was 03/22/2018. Physical Exam  Constitutional: She is oriented to person, place, and time. She appears well-developed and well-nourished. No distress.  Genitourinary: Vagina normal  and uterus normal. Pelvic exam was performed with patient supine. There is no rash, tenderness or lesion on the right labia. There is no rash, tenderness or lesion on the left labia. No erythema, tenderness or bleeding in the vagina. No signs of injury around the vagina. No vaginal discharge found. Right adnexum does not display mass, does not display tenderness and does not display fullness. Left adnexum does not display  mass, does not display tenderness and does not display fullness.  Cervix is nulliparous. Cervix does not exhibit motion tenderness, lesion, discharge, friability or polyp.   Uterus is mobile. Uterus is not enlarged, tender, exhibiting a mass or irregular (is regular).  HENT:  Head: Normocephalic and atraumatic.  Eyes: EOM are normal. No scleral icterus.  Neck: Normal range of motion. Neck supple. No thyromegaly present.  Cardiovascular: Normal rate and regular rhythm. Exam reveals no gallop and no friction rub.  No murmur heard. Pulmonary/Chest: Effort normal and breath sounds normal. No respiratory distress. She has no wheezes. She has no rales.  Abdominal: Soft. Bowel sounds are normal. She exhibits no distension and no mass. There is no tenderness. There is no rebound and no guarding.  Musculoskeletal: Normal range of motion. She exhibits no edema.  Lymphadenopathy:    She has no cervical adenopathy.  Neurological: She is alert and oriented to person, place, and time. No cranial nerve deficit.  Skin: Skin is warm and dry. No erythema.  Psychiatric: She has a normal mood and affect. Her behavior is normal. Judgment normal.   Endometrial Biopsy After discussion with the patient regarding her abnormal uterine bleeding I recommended that she proceed with an endometrial biopsy for further diagnosis. The risks, benefits, alternatives, and indications for an endometrial biopsy were discussed with the patient in detail. She understood the risks including infection, bleeding, cervical  laceration and uterine perforation.  Verbal consent was obtained.   PROCEDURE NOTE:  P[place] endometrial biopsy was performed using aseptic technique with iodine preparation.  The uterus was sounded to a length of 8 cm.  Adequate sampling was obtained with minimal blood loss.  The patient tolerated the procedure well.  Disposition will be pending pathology.   Female chaperone present for pelvic and breast  portions of the physical exam  Assessment: 47 y.o. G0P0000 female here for  1. Menorrhagia with irregular cycle      Plan: Problem List Items Addressed This Visit    None    Visit Diagnoses    Menorrhagia with irregular cycle    -  Primary   Relevant Orders   Pathology   US PELVIS TRANSVANGINAL NON-OB (TV ONLY)   IGP,CtNg,AptimaHPV,rfx16/18,45     Workup initiated for new patient problem.  Pap smear with STD screening today. Endometrial biopsy today. I have ordered a pelvic ultrasound today. She comes in today asking for a vaginal hysterectomy. Will assess for specific pathology and investigate neoplastic etiology.  Will discuss surgery further at her visit after her ultrasound.  Prentice Docker, MD 03/29/2018 1:35 PM

## 2018-03-31 LAB — PATHOLOGY

## 2018-04-01 LAB — IGP,CTNG,APTIMAHPV,RFX16/18,45
Chlamydia, Nuc. Acid Amp: NEGATIVE
Gonococcus by Nucleic Acid Amp: NEGATIVE
HPV Aptima: POSITIVE — AB
PAP SMEAR COMMENT: 0

## 2018-04-05 ENCOUNTER — Telehealth: Payer: Self-pay | Admitting: Obstetrics and Gynecology

## 2018-04-05 NOTE — Telephone Encounter (Signed)
Left generic VM 

## 2018-04-07 ENCOUNTER — Other Ambulatory Visit: Payer: BLUE CROSS/BLUE SHIELD

## 2018-04-07 ENCOUNTER — Ambulatory Visit: Payer: BLUE CROSS/BLUE SHIELD | Admitting: Obstetrics and Gynecology

## 2018-04-07 NOTE — Telephone Encounter (Signed)
Patient returning missed call from Dr. Glennon Mac

## 2018-04-14 ENCOUNTER — Ambulatory Visit (INDEPENDENT_AMBULATORY_CARE_PROVIDER_SITE_OTHER): Payer: BLUE CROSS/BLUE SHIELD

## 2018-04-14 ENCOUNTER — Ambulatory Visit (INDEPENDENT_AMBULATORY_CARE_PROVIDER_SITE_OTHER): Payer: BLUE CROSS/BLUE SHIELD | Admitting: Obstetrics and Gynecology

## 2018-04-14 VITALS — BP 124/74 | Ht 65.0 in | Wt 165.0 lb

## 2018-04-14 DIAGNOSIS — N921 Excessive and frequent menstruation with irregular cycle: Secondary | ICD-10-CM

## 2018-04-14 DIAGNOSIS — R87612 Low grade squamous intraepithelial lesion on cytologic smear of cervix (LGSIL): Secondary | ICD-10-CM | POA: Diagnosis not present

## 2018-04-14 NOTE — Progress Notes (Signed)
Gynecology Ultrasound Follow Up   Chief Complaint  Patient presents with  . Follow-up  after ultrasound menorrhagia with irregular cycle.   History of Present Illness: Patient is a 47 y.o. female who presents today for ultrasound evaluation of the above .  Ultrasound demonstrates the following findings Adnexa: no masses seen  Uterus: anteverted, dimensions: 8.8 x 5.3 x 4.6 cm with endometrial stripe  7.9 mm Additional: heterogeneous echotexture to uterus  Of note, she had an endometrial biopsy that showed proliferative endometrium without hyperplasia, carcinoma, or endometritis.  Her pap smear returned as LGSIL with +HPV.  Colposcopy has been recommended to her.  Past Medical History:  Diagnosis Date  . Abnormal Pap smear 08/28/09   lgsil  . Abnormal Pap smear 01/29/09   lgsil  . Abnormal uterine bleeding    CONTROLLED W/DEPO PROVERA  . Anxiety   . High triglycerides   . Psoriasis   . Sleep difficulties   . SVT (supraventricular tachycardia) (HCC)     Past Surgical History:  Procedure Laterality Date  . BREAST ENHANCEMENT SURGERY  1998   Dr. Wendy Poet  . CARDIAC ELECTROPHYSIOLOGY STUDY AND ABLATION    . ELECTROPHYSIOLOGIC STUDY N/A 10/18/2015   Procedure: SVT Ablation;  Surgeon: Evans Lance, MD;  Location: [facility];  Service: Cardiovascular;  Laterality: N/A;  . ENDOMETRIAL BIOPSY  03/06/09   low grade squamous CIN1  . SVT ABLATION N/A 05/27/2017   Procedure: SVT Ablation;  Surgeon: Evans Lance, MD;  Location: [facility];  Service: Cardiovascular;  Laterality: N/A;    Family History  Problem Relation Age of Onset  . Diabetes Mother   . Hypertension Mother   . Cancer Father 61       LUNG   . CVA Father   . Cancer Maternal Grandmother        PANCREATIC  . Cancer Maternal Grandfather        LUNG  . Ovarian cancer Maternal Grandfather   . Diabetes Paternal Grandmother     Social History   Socioeconomic History  . Marital status:  Married    Spouse name: Not on file  . Number of children: Not on file  . Years of education: Not on file  . Highest education level: Not on file  Occupational History  . Not on file  Social Needs  . Financial resource strain: Not on file  . Food insecurity:    Worry: Not on file    Inability: Not on file  . Transportation needs:    Medical: Not on file    Non-medical: Not on file  Tobacco Use  . Smoking status: Current Every Day Smoker    Packs/day: 0.50  . Smokeless tobacco: Never Used  Substance and Sexual Activity  . Alcohol use: Yes    Comment: rare  . Drug use: No  . Sexual activity: Yes    Partners: Male    Birth control/protection: None  Lifestyle  . Physical activity:    Days per week: Not on file    Minutes per session: Not on file  . Stress: Not on file  Relationships  . Social connections:    Talks on phone: Not on file    Gets together: Not on file    Attends religious service: Not on file    Active member of club or organization: Not on file    Attends meetings of clubs or organizations: Not on file    Relationship status: Not  on file  . Intimate partner violence:    Fear of current or ex partner: Not on file    Emotionally abused: Not on file    Physically abused: Not on file    Forced sexual activity: Not on file  Other Topics Concern  . Not on file  Social History Narrative  . Not on file    Allergies  Allergen Reactions  . Vancomycin Itching and Rash    Prior to Admission medications   Medication Sig Start Date End Date Taking? Authorizing Provider  cyanocobalamin (,VITAMIN B-12,) 1000 MCG/ML injection Inject 1 mcg into the muscle every 30 (thirty) days. 09/03/15   [provider]  diltiazem (CARDIZEM CD) 120 MG 24 hr capsule Take 1 capsule (120 mg total) by mouth daily. Patient taking differently: Take 120 mg by mouth at bedtime.  01/18/16   Evans Lance, MD  diltiazem (CARDIZEM) 30 MG tablet Take 1 tablet (30 mg total) by mouth  3 (three) times daily as needed. 05/30/17   Minna Merritts, MD  DULoxetine (CYMBALTA) 60 MG capsule Take 60 mg by mouth at bedtime.  04/09/15   [provider]  ibuprofen (ADVIL,MOTRIN) 200 MG tablet Take 400 mg by mouth every 8 (eight) hours as needed (for pain.).    [provider]  medroxyPROGESTERone (DEPO-PROVERA) 150 MG/ML injection Inject 150 mg into the muscle every 3 (three) months.    [provider]  ranitidine (ZANTAC) 150 MG tablet Take 150 mg by mouth at bedtime.    [provider]    Physical Exam BP 124/74   Ht 5\' 5"  (1.651 m)   Wt 165 lb (74.8 kg)   LMP 03/22/2018   BMI 27.46 kg/m    General: NAD HEENT: normocephalic, anicteric Pulmonary: No increased work of breathing Extremities: no edema, erythema, or tenderness Neurologic: Grossly intact, normal gait Psychiatric: mood appropriate, affect full  US Pelvis Transvanginal Non-ob (tv Only)  Result Date: 04/14/2018 Patient Name: Sarah Holmes DOB: [birth year] MRN: [id number] ULTRASOUND REPORT Location: [facility] Date of Service: 04/14/2018 Indications:MENORRHAGIA Findings: The uterus is anteverted and measures 8.79 X 5.25 X 4.62 CM. Echo texture is heterogenous without evidence of focal masses. The Endometrium measures 7.92 mm. Right Ovary measures 4.14 X 3.57 X 3.09 cm. It is normal in appearance and contain follicles. Left Ovary measures 3.26 x 2.92 x 2.06 cm. It is normal in appearance and contain follicles Survey of the adnexa demonstrates no adnexal masses. There is no free fluid in the cul de sac. Impression: 1. Anteverted uterus with heterogenous texture. Recommendations: 1.Clinical correlation with the patient's History and Physical Exam. Sarah Holmes Marlowe Sax, RDMS The ultrasound images and findings were reviewed by me and I agree with the above report. Prentice Docker, MD, [facility], [facility] 04/14/2018 11:55 AM    Assessment: 47 y.o. G0P0000  1.  Menorrhagia with irregular cycle   2. LGSIL on Pap smear of cervix      Plan: Problem List Items Addressed This Visit      Other   Menorrhagia with irregular cycle - Primary   LGSIL on Pap smear of cervix     Discussed treatment options. She strongly desires hysterectomy.  She has requested a vaginal hysterectomy. Will have to repeat a brief pelvic exam to assess descensus of pelvic floor. Discussed my proficiency is highest in laparoscopic surgery.  I recommended that she undergo colposcopy to rule out cancer or high-grade dysplasia. Once this is  complete, we can schedule her surgery and type of surgery.    15 minutes spent in face to face discussion with > 50% spent in counseling,management, and coordination of care of her menorrhagia and LGSIL pap smear.   Prentice Docker, MD, [facility], [facility] 04/15/2018 11:23 AM

## 2018-04-15 ENCOUNTER — Encounter: Payer: Self-pay | Admitting: Obstetrics and Gynecology

## 2018-04-15 DIAGNOSIS — R87612 Low grade squamous intraepithelial lesion on cytologic smear of cervix (LGSIL): Secondary | ICD-10-CM | POA: Insufficient documentation

## 2018-04-15 DIAGNOSIS — N921 Excessive and frequent menstruation with irregular cycle: Secondary | ICD-10-CM | POA: Insufficient documentation

## 2018-05-17 ENCOUNTER — Ambulatory Visit: Payer: BLUE CROSS/BLUE SHIELD | Admitting: Obstetrics and Gynecology

## 2018-05-27 ENCOUNTER — Encounter: Payer: Self-pay | Admitting: Obstetrics and Gynecology

## 2018-05-27 ENCOUNTER — Ambulatory Visit (INDEPENDENT_AMBULATORY_CARE_PROVIDER_SITE_OTHER): Payer: BLUE CROSS/BLUE SHIELD | Admitting: Obstetrics and Gynecology

## 2018-05-27 VITALS — BP 122/62 | HR 98 | Ht 65.5 in | Wt 161.0 lb

## 2018-05-27 DIAGNOSIS — R87612 Low grade squamous intraepithelial lesion on cytologic smear of cervix (LGSIL): Secondary | ICD-10-CM | POA: Diagnosis not present

## 2018-05-27 DIAGNOSIS — B977 Papillomavirus as the cause of diseases classified elsewhere: Secondary | ICD-10-CM | POA: Diagnosis not present

## 2018-05-27 NOTE — Progress Notes (Signed)
HPI:  Sarah Holmes is a 47 y.o.  G0P0000  who presents today for evaluation and management of abnormal cervical cytology.    Dysplasia History:  LGSIL, HPV +   OB History  Gravida Para Term Preterm AB Living  0 0 0 0 0 0  SAB TAB Ectopic Multiple Live Births  0 0 0 0      Past Medical History:  Diagnosis Date  . Abnormal Pap smear 08/28/09   lgsil  . Abnormal Pap smear 01/29/09   lgsil  . Abnormal uterine bleeding    CONTROLLED W/DEPO PROVERA  . Anxiety   . High triglycerides   . Psoriasis   . Sleep difficulties   . SVT (supraventricular tachycardia) (HCC)     Past Surgical History:  Procedure Laterality Date  . BREAST ENHANCEMENT SURGERY  1998   Dr. Wendy Poet  . CARDIAC ELECTROPHYSIOLOGY STUDY AND ABLATION    . ELECTROPHYSIOLOGIC STUDY N/A 10/18/2015   Procedure: SVT Ablation;  Surgeon: Evans Lance, MD;  Location: [facility];  Service: Cardiovascular;  Laterality: N/A;  . ENDOMETRIAL BIOPSY  03/06/09   low grade squamous CIN1  . SVT ABLATION N/A 05/27/2017   Procedure: SVT Ablation;  Surgeon: Evans Lance, MD;  Location: [facility];  Service: Cardiovascular;  Laterality: N/A;    SOCIAL HISTORY:  Social History   Substance and Sexual Activity  Alcohol Use Yes   Comment: rare    Social History   Substance and Sexual Activity  Drug Use No     Family History  Problem Relation Age of Onset  . Diabetes Mother   . Hypertension Mother   . Cancer Father 32       LUNG   . CVA Father   . Cancer Maternal Grandmother        PANCREATIC  . Cancer Maternal Grandfather        LUNG  . Ovarian cancer Maternal Grandfather   . Diabetes Paternal Grandmother     ALLERGIES:  Vancomycin  Current Outpatient Medications on File Prior to Visit  Medication Sig Dispense Refill  . cyanocobalamin (,VITAMIN B-12,) 1000 MCG/ML injection Inject 1 mcg into the muscle every 30 (thirty) days.    Marland Kitchen diltiazem (CARDIZEM CD) 120 MG 24 hr capsule Take 1  capsule (120 mg total) by mouth daily. (Patient taking differently: Take 120 mg by mouth at bedtime. ) 90 capsule 3  . diltiazem (CARDIZEM) 30 MG tablet Take 1 tablet (30 mg total) by mouth 3 (three) times daily as needed. 90 tablet 3  . DULoxetine (CYMBALTA) 60 MG capsule Take 60 mg by mouth at bedtime.     Marland Kitchen ibuprofen (ADVIL,MOTRIN) 200 MG tablet Take 400 mg by mouth every 8 (eight) hours as needed (for pain.).    Marland Kitchen ranitidine (ZANTAC) 150 MG tablet Take 150 mg by mouth at bedtime.     No current facility-administered medications on file prior to visit.     Physical Exam: -Vitals:  BP 122/62 (BP Location: Left Arm, Patient Position: Sitting, Cuff Size: Normal)   Pulse 98   Ht 5' 5.5" (1.664 m)   Wt 161 lb (73 kg)   LMP 05/13/2018 (Exact Date)   BMI 26.38 kg/m  GEN: WD, WN, NAD.  A+ O x 3, good mood and affect. ABD:  NT, ND.  Soft, no masses.  No hernias noted.   Pelvic:   Vulva: Normal appearance.  No lesions.  Vagina: No lesions or abnormalities  noted.  Support: Normal pelvic support.  Urethra No masses tenderness or scarring.  Meatus Normal size without lesions or prolapse.  Cervix: See below.  Anus: Normal exam.  No lesions.  Perineum: Normal exam.  No lesions.        Bimanual   Uterus: Normal size.  Non-tender.  Mobile.  AV.  Adnexae: No masses.  Non-tender to palpation.  Cul-de-sac: Negative for abnormality.   PROCEDURE: 1.  Urine Pregnancy Test:  not done 2.  Colposcopy performed with 4% acetic acid after verbal consent obtained                                         -Aceto-white Lesions Location(s): mild diffuse               -Biopsy performed at 6, 12 o'clock               -ECC indicated and performed: Yes.       -Biopsy sites made hemostatic with pressure, AgNO3, and/or Monsel's solution   -Satisfactory colposcopy: No.    -Evidence of Invasive cervical CA :  NO  ASSESSMENT:  Sarah Holmes is a 47 y.o. G0P0000 here for  1. LGSIL on Pap smear of cervix    .  PLAN: I discussed the grading system of pap smears and HPV high risk viral types.  We will discuss and base management after colpo results return.     Prentice Docker, MD  [facility], [facility] 05/27/2018  9:47 AM

## 2018-05-31 LAB — PATHOLOGY

## 2018-06-03 ENCOUNTER — Encounter: Payer: Self-pay | Admitting: Obstetrics and Gynecology

## 2018-06-03 ENCOUNTER — Telehealth: Payer: Self-pay

## 2018-06-03 DIAGNOSIS — F411 Generalized anxiety disorder: Secondary | ICD-10-CM | POA: Diagnosis not present

## 2018-06-03 DIAGNOSIS — I471 Supraventricular tachycardia: Secondary | ICD-10-CM | POA: Diagnosis not present

## 2018-06-03 NOTE — Telephone Encounter (Signed)
-----   Message from Will Bonnet, MD sent at 06/03/2018  1:49 PM EDT ----- Would you mind calling this patient and letting her know that her biopsies were normal? I recommend she have a repeat pap smear in one year as her follow up. If she would like a call from me directly, I am happy to review this with her. Thank you! Prentice Docker, MD

## 2018-06-03 NOTE — Telephone Encounter (Signed)
Called pt. No answer. Will try later.

## 2018-06-08 NOTE — Telephone Encounter (Signed)
Spoke to patient. Gave results. Pt verbalized understanding.  

## 2018-08-09 DIAGNOSIS — Z79899 Other long term (current) drug therapy: Secondary | ICD-10-CM | POA: Diagnosis not present

## 2018-08-09 DIAGNOSIS — L400 Psoriasis vulgaris: Secondary | ICD-10-CM | POA: Diagnosis not present

## 2018-08-09 DIAGNOSIS — L0109 Other impetigo: Secondary | ICD-10-CM | POA: Diagnosis not present

## 2018-09-14 DIAGNOSIS — K219 Gastro-esophageal reflux disease without esophagitis: Secondary | ICD-10-CM | POA: Diagnosis not present

## 2018-09-14 DIAGNOSIS — R1310 Dysphagia, unspecified: Secondary | ICD-10-CM | POA: Diagnosis not present

## 2018-09-14 DIAGNOSIS — I471 Supraventricular tachycardia: Secondary | ICD-10-CM | POA: Diagnosis not present

## 2018-09-29 ENCOUNTER — Other Ambulatory Visit: Payer: Self-pay

## 2018-09-29 ENCOUNTER — Encounter (INDEPENDENT_AMBULATORY_CARE_PROVIDER_SITE_OTHER): Payer: Self-pay

## 2018-09-29 ENCOUNTER — Telehealth: Payer: Self-pay | Admitting: Gastroenterology

## 2018-09-29 ENCOUNTER — Other Ambulatory Visit: Payer: Self-pay | Admitting: Gastroenterology

## 2018-09-29 ENCOUNTER — Ambulatory Visit (INDEPENDENT_AMBULATORY_CARE_PROVIDER_SITE_OTHER): Payer: BLUE CROSS/BLUE SHIELD | Admitting: Gastroenterology

## 2018-09-29 ENCOUNTER — Encounter: Payer: Self-pay | Admitting: Gastroenterology

## 2018-09-29 ENCOUNTER — Encounter: Payer: Self-pay | Admitting: *Deleted

## 2018-09-29 VITALS — BP 140/81 | HR 105 | Ht 65.0 in | Wt 154.0 lb

## 2018-09-29 DIAGNOSIS — R4702 Dysphasia: Secondary | ICD-10-CM | POA: Diagnosis not present

## 2018-09-29 DIAGNOSIS — Z8371 Family history of colonic polyps: Secondary | ICD-10-CM

## 2018-09-29 DIAGNOSIS — R1310 Dysphagia, unspecified: Secondary | ICD-10-CM

## 2018-09-29 MED ORDER — NA SULFATE-K SULFATE-MG SULF 17.5-3.13-1.6 GM/177ML PO SOLN: 1.0000 | ORAL | 0 refills | Status: DC

## 2018-09-29 NOTE — Telephone Encounter (Signed)
Left vm informing pt I have changed her procedure date to 10/04/18.

## 2018-09-29 NOTE — Progress Notes (Signed)
Gastroenterology Consultation  Referring Provider:     Kirk Ruths, MD Primary Care Physician:  Kirk Ruths, MD Primary Gastroenterologist:  Dr. Allen Norris     Reason for Consultation:     Dysphasia        HPI:   Sarah Holmes is a 47 y.o. y/o female referred for consultation & management of dysphasia by Dr. Ouida Sills, Ocie Cornfield, MD.  This patient comes in today after being referred for dysphasia. She also has a sister with polyps.  The patient reports that her dysphasia is mostly feeling fullness in her esophagus like food is not going down after she has eaten.  There is no report of any food getting stuck to the point where she had to vomit the food up.  She also denies any unexplained weight loss fevers chills nausea vomiting abdominal pain bloating, black stools or bloody stools. The patient is concerned because her father had esophageal cancer and lung cancer.  The patient is a smoker as was her father.  The patient also reports that her mother and father had colon polyps and her sister had colon polyps before the age of 46.  The patient has never had a colonoscopy in the past.  Past Medical History:  Diagnosis Date  . Abnormal Pap smear 08/28/09   lgsil  . Abnormal Pap smear 01/29/09   lgsil  . Abnormal uterine bleeding    CONTROLLED W/DEPO PROVERA  . Anxiety   . High triglycerides   . Psoriasis   . Sleep difficulties   . SVT (supraventricular tachycardia) (HCC)     Past Surgical History:  Procedure Laterality Date  . BREAST ENHANCEMENT SURGERY  1998   Dr. Wendy Poet  . CARDIAC ELECTROPHYSIOLOGY STUDY AND ABLATION    . ELECTROPHYSIOLOGIC STUDY N/A 10/18/2015   Procedure: SVT Ablation;  Surgeon: Evans Lance, MD;  Location: [facility];  Service: Cardiovascular;  Laterality: N/A;  . ENDOMETRIAL BIOPSY  03/06/09   low grade squamous CIN1  . SVT ABLATION N/A 05/27/2017   Procedure: SVT Ablation;  Surgeon: Evans Lance, MD;  Location: [facility];   Service: Cardiovascular;  Laterality: N/A;    Prior to Admission medications   Medication Sig Start Date End Date Taking? Authorizing Provider  cyanocobalamin (,VITAMIN B-12,) 1000 MCG/ML injection Inject 1 mcg into the muscle every 30 (thirty) days. 09/03/15   [provider]  diltiazem (CARDIZEM CD) 120 MG 24 hr capsule Take 1 capsule (120 mg total) by mouth daily. Patient taking differently: Take 120 mg by mouth at bedtime.  01/18/16   Evans Lance, MD  diltiazem (CARDIZEM) 30 MG tablet Take 1 tablet (30 mg total) by mouth 3 (three) times daily as needed. 05/30/17   Minna Merritts, MD  DULoxetine (CYMBALTA) 60 MG capsule Take 60 mg by mouth at bedtime.  04/09/15   [provider]  ibuprofen (ADVIL,MOTRIN) 200 MG tablet Take 400 mg by mouth every 8 (eight) hours as needed (for pain.).    [provider]  ranitidine (ZANTAC) 150 MG tablet Take 150 mg by mouth at bedtime.    [provider]    Family History  Problem Relation Age of Onset  . Diabetes Mother   . Hypertension Mother   . Cancer Father 62       LUNG   . CVA Father   . Cancer Maternal Grandmother        PANCREATIC  . Cancer Maternal Grandfather  LUNG  . Ovarian cancer Maternal Grandfather   . Diabetes Paternal Grandmother      Social History   Tobacco Use  . Smoking status: Current Every Day Smoker    Packs/day: 0.50  . Smokeless tobacco: Never Used  Substance Use Topics  . Alcohol use: Yes    Comment: rare  . Drug use: No    Allergies as of 09/29/2018 - Review Complete 05/27/2018  Allergen Reaction Noted  . Vancomycin Itching and Rash 05/29/2017    Review of Systems:    All systems reviewed and negative except where noted in HPI.   Physical Exam:  There were no vitals taken for this visit. No LMP recorded. General:   Alert,  Well-developed, well-nourished, pleasant and cooperative in NAD Head:  Normocephalic and atraumatic. Eyes:  Sclera clear, no icterus.    Conjunctiva pink. Ears:  Normal auditory acuity. Nose:  No deformity, discharge, or lesions. Mouth:  No deformity or lesions,oropharynx pink & moist. Neck:  Supple; no masses or thyromegaly. Lungs:  Respirations even and unlabored.  Clear throughout to auscultation.   No wheezes, crackles, or rhonchi. No acute distress. Heart:  Regular rate and rhythm; no murmurs, clicks, rubs, or gallops. Abdomen:  Normal bowel sounds.  No bruits.  Soft, non-tender and non-distended without masses, hepatosplenomegaly or hernias noted.  No guarding or rebound tenderness.  Negative Carnett sign.   Rectal:  Deferred.  Msk:  Symmetrical without gross deformities.  Good, equal movement & strength bilaterally. Pulses:  Normal pulses noted. Extremities:  No clubbing or edema.  No cyanosis. Neurologic:  Alert and oriented x3;  grossly normal neurologically. Skin:  Intact without significant lesions or rashes.  No jaundice. Lymph Nodes:  No significant cervical adenopathy. Psych:  Alert and cooperative. Normal mood and affect.  Imaging Studies: No results found.  Assessment and Plan:   [name] is a 47 y.o. y/o female who has a history of dysphasia.  The patient states that dysphasia is not any worse to liquids or solids but reports that she feels a fullness in her esophagus after eating.  The patient also has a family history of colon polyps.  She denies any unexplained weight loss but does have alternating diarrhea and constipation.  The patient will be set up for an EGD and colonoscopy. I have discussed risks & benefits which include, but are not limited to, bleeding, infection, perforation & drug reaction.  The patient agrees with this plan & written consent will be obtained.     Lucilla Lame, MD. [facility]    Note: This dictation was prepared with Dragon dictation along with smaller phrase technology. Any transcriptional errors that result from this process are unintentional.

## 2018-09-29 NOTE — Telephone Encounter (Signed)
Patient called after leaving Dr Dorothey Baseman office and scheduling a endoscopy for 10-11-18 and would like to change this to 10-04-18.Please call & advise [phone].

## 2018-09-30 ENCOUNTER — Other Ambulatory Visit: Payer: Self-pay

## 2018-09-30 MED ORDER — PEG 3350-KCL-NABCB-NACL-NASULF 236 G PO SOLR: ORAL | 0 refills | Status: DC

## 2018-10-01 NOTE — Discharge Instructions (Signed)
General Anesthesia, Adult, Care After °These instructions provide you with information about caring for yourself after your procedure. Your health care provider may also give you more specific instructions. Your treatment has been planned according to current medical practices, but problems sometimes occur. Call your health care provider if you have any problems or questions after your procedure. °What can I expect after the procedure? °After the procedure, it is common to have: °· Vomiting. °· A sore throat. °· Mental slowness. ° °It is common to feel: °· Nauseous. °· Cold or shivery. °· Sleepy. °· Tired. °· Sore or achy, even in parts of your body where you did not have surgery. ° °Follow these instructions at home: °For at least 24 hours after the procedure: °· Do not: °? Participate in activities where you could fall or become injured. °? Drive. °? Use heavy machinery. °? Drink alcohol. °? Take sleeping pills or medicines that cause drowsiness. °? Make important decisions or sign legal documents. °? Take care of children on your own. °· Rest. °Eating and drinking °· If you vomit, drink water, juice, or soup when you can drink without vomiting. °· Drink enough fluid to keep your urine clear or pale yellow. °· Make sure you have little or no nausea before eating solid foods. °· Follow the diet recommended by your health care provider. °General instructions °· Have a responsible adult stay with you until you are awake and alert. °· Return to your normal activities as told by your health care provider. Ask your health care provider what activities are safe for you. °· Take over-the-counter and prescription medicines only as told by your health care provider. °· If you smoke, do not smoke without supervision. °· Keep all follow-up visits as told by your health care provider. This is important. °Contact a health care provider if: °· You continue to have nausea or vomiting at home, and medicines are not helpful. °· You  cannot drink fluids or start eating again. °· You cannot urinate after 8-12 hours. °· You develop a skin rash. °· You have fever. °· You have increasing redness at the site of your procedure. °Get help right away if: °· You have difficulty breathing. °· You have chest pain. °· You have unexpected bleeding. °· You feel that you are having a life-threatening or urgent problem. °This information is not intended to replace advice given to you by your health care provider. Make sure you discuss any questions you have with your health care provider. °Document Released: 03/09/2001 Document Revised: 05/05/2016 Document Reviewed: 11/15/2015 °Elsevier Interactive Patient Education © 2018 [employer] Inc. ° °

## 2018-10-04 ENCOUNTER — Ambulatory Visit: Payer: BLUE CROSS/BLUE SHIELD | Admitting: Anesthesiology

## 2018-10-04 ENCOUNTER — Encounter
Admission: RE | Disposition: A | Discharge status: Home / Self Care | Payer: Self-pay | Source: Ambulatory Visit | Attending: Gastroenterology

## 2018-10-04 ENCOUNTER — Ambulatory Visit
Admission: RE | Admit: 2018-10-04 | Discharge: 2018-10-04 | Disposition: A | Discharge status: Home / Self Care | Payer: BLUE CROSS/BLUE SHIELD | Source: Ambulatory Visit | Attending: Gastroenterology | Admitting: Gastroenterology

## 2018-10-04 DIAGNOSIS — I471 Supraventricular tachycardia: Secondary | ICD-10-CM | POA: Insufficient documentation

## 2018-10-04 DIAGNOSIS — F419 Anxiety disorder, unspecified: Secondary | ICD-10-CM | POA: Insufficient documentation

## 2018-10-04 DIAGNOSIS — B3781 Candidal esophagitis: Secondary | ICD-10-CM | POA: Diagnosis not present

## 2018-10-04 DIAGNOSIS — F17210 Nicotine dependence, cigarettes, uncomplicated: Secondary | ICD-10-CM | POA: Diagnosis not present

## 2018-10-04 DIAGNOSIS — K5730 Diverticulosis of large intestine without perforation or abscess without bleeding: Secondary | ICD-10-CM | POA: Insufficient documentation

## 2018-10-04 DIAGNOSIS — Z79899 Other long term (current) drug therapy: Secondary | ICD-10-CM | POA: Insufficient documentation

## 2018-10-04 DIAGNOSIS — Z1211 Encounter for screening for malignant neoplasm of colon: Secondary | ICD-10-CM | POA: Insufficient documentation

## 2018-10-04 DIAGNOSIS — K635 Polyp of colon: Secondary | ICD-10-CM | POA: Diagnosis not present

## 2018-10-04 DIAGNOSIS — D125 Benign neoplasm of sigmoid colon: Secondary | ICD-10-CM | POA: Diagnosis not present

## 2018-10-04 DIAGNOSIS — R1310 Dysphagia, unspecified: Secondary | ICD-10-CM

## 2018-10-04 DIAGNOSIS — Z8371 Family history of colonic polyps: Secondary | ICD-10-CM | POA: Diagnosis not present

## 2018-10-04 DIAGNOSIS — K229 Disease of esophagus, unspecified: Secondary | ICD-10-CM

## 2018-10-04 DIAGNOSIS — Z83719 Family history of colon polyps, unspecified: Secondary | ICD-10-CM

## 2018-10-04 DIAGNOSIS — Z800 Family history of malignant neoplasm of digestive organs: Secondary | ICD-10-CM | POA: Insufficient documentation

## 2018-10-04 HISTORY — PX: ESOPHAGOGASTRODUODENOSCOPY (EGD) WITH PROPOFOL: SHX5813

## 2018-10-04 HISTORY — PX: POLYPECTOMY: SHX5525

## 2018-10-04 HISTORY — PX: COLONOSCOPY WITH PROPOFOL: SHX5780

## 2018-10-04 SURGERY — COLONOSCOPY WITH PROPOFOL
Anesthesia: General | Site: Throat

## 2018-10-04 MED ORDER — PROPOFOL 10 MG/ML IV BOLUS
INTRAVENOUS | Status: DC | PRN
  Administered 2018-10-04 (×6): 40 mg via INTRAVENOUS
  Administered 2018-10-04: 150 mg via INTRAVENOUS
  Administered 2018-10-04: 40 mg via INTRAVENOUS
  Administered 2018-10-04: 50 mg via INTRAVENOUS

## 2018-10-04 MED ORDER — GLYCOPYRROLATE 0.2 MG/ML IJ SOLN
INTRAMUSCULAR | Status: DC | PRN
  Administered 2018-10-04: 0.1 mg via INTRAVENOUS

## 2018-10-04 MED ORDER — LIDOCAINE HCL (CARDIAC) PF 100 MG/5ML IV SOSY
PREFILLED_SYRINGE | INTRAVENOUS | Status: DC | PRN
  Administered 2018-10-04: 50 mg via INTRAVENOUS

## 2018-10-04 MED ORDER — STERILE WATER FOR IRRIGATION IR SOLN
Status: DC | PRN
  Administered 2018-10-04: 10:00:00

## 2018-10-04 MED ORDER — ONDANSETRON HCL 4 MG/2ML IJ SOLN: 4.0000 mg | Freq: Once | INTRAMUSCULAR | Status: DC | PRN

## 2018-10-04 MED ORDER — SODIUM CHLORIDE 0.9 % IV SOLN: INTRAVENOUS | Status: DC

## 2018-10-04 MED ORDER — LACTATED RINGERS IV SOLN
10.0000 mL/h | INTRAVENOUS | Status: DC
  Administered 2018-10-04: 10 mL/h via INTRAVENOUS

## 2018-10-04 SURGICAL SUPPLY — 7 items
BLOCK BITE 60FR ADLT L/F GRN (MISCELLANEOUS) ×4 IMPLANT
CANISTER SUCT 1200ML W/VALVE (MISCELLANEOUS) ×4 IMPLANT
FORCEPS BIOP RAD 4 LRG CAP 4 (CUTTING FORCEPS) ×4 IMPLANT
GOWN CVR UNV OPN BCK APRN NK (MISCELLANEOUS) ×4 IMPLANT
GOWN ISOL THUMB LOOP REG UNIV (MISCELLANEOUS) ×4
KIT ENDO PROCEDURE OLY (KITS) ×4 IMPLANT
WATER STERILE IRR 250ML POUR (IV SOLUTION) ×4 IMPLANT

## 2018-10-04 NOTE — Op Note (Signed)
[facility] Gastroenterology Patient Name: Sarah Holmes Procedure Date: 10/04/2018 9:41 AM MRN: [id number] Account #: [id number] Date of Birth: [birth year] Admit Type: Outpatient Age: 47 Room: [facility] OR ROOM 01 Gender: Female Note Status: Finalized Procedure:            Colonoscopy Indications:          Family history of colonic polyps in a first-degree                        relative Providers:            Lucilla Lame MD, MD Medicines:            Propofol per Anesthesia Complications:        No immediate complications. Procedure:            Pre-Anesthesia Assessment:                       - Prior to the procedure, a History and Physical was                        performed, and patient medications and allergies were                        reviewed. The patient's tolerance of previous                        anesthesia was also reviewed. The risks and benefits of                        the procedure and the sedation options and risks were                        discussed with the patient. All questions were                        answered, and informed consent was obtained. Prior                        Anticoagulants: The patient has taken no previous                        anticoagulant or antiplatelet agents. ASA Grade                        Assessment: II - A patient with mild systemic disease.                        After reviewing the risks and benefits, the patient was                        deemed in satisfactory condition to undergo the                        procedure.                       After obtaining informed consent, the colonoscope was                        passed under direct vision. Throughout the procedure,  the patient's blood pressure, pulse, and oxygen                        saturations were monitored continuously. The                        Colonoscope was introduced through the anus and                        advanced  to the the cecum, identified by appendiceal                        orifice and ileocecal valve. The colonoscopy was                        performed without difficulty. The patient tolerated the                        procedure well. The quality of the bowel preparation                        was excellent. Findings:      The perianal and digital rectal examinations were normal.      Two sessile polyps were found in the sigmoid colon. The polyps were 2 to       3 mm in size. These polyps were removed with a cold biopsy forceps.       Resection and retrieval were complete.      Multiple small-mouthed diverticula were found in the sigmoid colon. Impression:           - Two 2 to 3 mm polyps in the sigmoid colon, removed                        with a cold biopsy forceps. Resected and retrieved.                       - Diverticulosis in the sigmoid colon. Recommendation:       - Discharge patient to home.                       - Resume previous diet.                       - Continue present medications.                       - Await pathology results.                       - Repeat colonoscopy in 5 years for surveillance. Procedure Code(s):    --- Professional ---                       [phone], Colonoscopy, flexible; with biopsy, single or                        multiple Diagnosis Code(s):    --- Professional ---                       Z83.71, Family history of colonic polyps  D12.5, Benign neoplasm of sigmoid colon CPT copyright 2018 [employer]. All rights reserved. The codes documented in this report are preliminary and upon coder review may  be revised to meet current compliance requirements. Lucilla Lame MD, MD 10/04/2018 10:11:35 AM This report has been signed electronically. Number of Addenda: 0 Note Initiated On: 10/04/2018 9:41 AM Scope Withdrawal Time: 0 hours 7 minutes 18 seconds  Total Procedure Duration: 0 hours 11 minutes 45 seconds        [facility]

## 2018-10-04 NOTE — Op Note (Signed)
[facility] Gastroenterology Patient Name: Sarah Holmes Procedure Date: 10/04/2018 9:42 AM MRN: [id number] Account #: [id number] Date of Birth: [birth year] Admit Type: Outpatient Age: 47 Room: [facility] OR ROOM 01 Gender: Female Note Status: Finalized Procedure:            Upper GI endoscopy Indications:          Dysphagia Providers:            Lucilla Lame MD, MD Referring MD:         Ocie Cornfield. Ouida Sills MD, MD (Referring MD) Medicines:            Propofol per Anesthesia Complications:        No immediate complications. Procedure:            Pre-Anesthesia Assessment:                       - Prior to the procedure, a History and Physical was                        performed, and patient medications and allergies were                        reviewed. The patient's tolerance of previous                        anesthesia was also reviewed. The risks and benefits of                        the procedure and the sedation options and risks were                        discussed with the patient. All questions were                        answered, and informed consent was obtained. Prior                        Anticoagulants: The patient has taken no previous                        anticoagulant or antiplatelet agents. ASA Grade                        Assessment: II - A patient with mild systemic disease.                        After reviewing the risks and benefits, the patient was                        deemed in satisfactory condition to undergo the                        procedure.                       After obtaining informed consent, the endoscope was                        passed under direct vision. Throughout the procedure,  the patient's blood pressure, pulse, and oxygen                        saturations were monitored continuously. The was                        introduced through the mouth, and advanced to the                        second  part of duodenum. The upper GI endoscopy was                        accomplished without difficulty. The patient tolerated                        the procedure well. Findings:      Diffuse, white plaques were found in the entire esophagus. Cells for       cytology were obtained by brushing.      Biopsies were obtained with cold forceps for histology in the middle       third of the esophagus.      The stomach was normal.      The examined duodenum was normal. Impression:           - Esophageal plaques were found, suspicious for                        candidiasis. Cells for cytology obtained.                       - Normal stomach.                       - Normal examined duodenum.                       - Biopsy performed in the middle third of the esophagus. Recommendation:       - Discharge patient to home.                       - Resume previous diet.                       - Continue present medications.                       - Await pathology results. Procedure Code(s):    --- Professional ---                       [phone], Esophagogastroduodenoscopy, flexible, transoral;                        with biopsy, single or multiple Diagnosis Code(s):    --- Professional ---                       R13.10, Dysphagia, unspecified                       K22.9, Disease of esophagus, unspecified CPT copyright 2018 American Medical Association. All rights reserved. The codes documented in this report are preliminary and upon coder review may  be revised to meet current compliance requirements. Cassiel Fernandez  Elke Holtry MD, MD 10/04/2018 9:56:55 AM This report has been signed electronically. Number of Addenda: 0 Note Initiated On: 10/04/2018 9:42 AM      [facility]

## 2018-10-04 NOTE — Anesthesia Postprocedure Evaluation (Signed)
Anesthesia Post Note  Patient: Sarah Holmes  Procedure(s) Performed: COLONOSCOPY WITH Biopsies (N/A Rectum) ESOPHAGOGASTRODUODENOSCOPY (EGD) WITH Biopsies (N/A Throat) POLYPECTOMY (N/A Rectum)  Patient location during evaluation: PACU Anesthesia Type: General Level of consciousness: awake Pain management: pain level controlled Vital Signs Assessment: post-procedure vital signs reviewed and stable Respiratory status: respiratory function stable Cardiovascular status: stable Postop Assessment: no signs of nausea or vomiting Anesthetic complications: no    Veda Canning

## 2018-10-04 NOTE — H&P (Signed)
Lucilla Lame, MD [address] [place], [place] [address] Phone:[phone] Fax : [phone]  Primary Care Physician:  Kirk Ruths, MD Primary Gastroenterologist:  Dr. Allen Norris  Pre-Procedure History & Physical: HPI:  Sarah Holmes is a 47 y.o. female is here for an endoscopy and colonoscopy.   Past Medical History:  Diagnosis Date  . Abnormal Pap smear 08/28/09   lgsil  . Abnormal Pap smear 01/29/09   lgsil  . Abnormal uterine bleeding    CONTROLLED W/DEPO PROVERA  . Anxiety   . High triglycerides   . Psoriasis   . Sleep difficulties   . SVT (supraventricular tachycardia) (HCC)     Past Surgical History:  Procedure Laterality Date  . BREAST ENHANCEMENT SURGERY  1998   Dr. Wendy Poet  . CARDIAC ELECTROPHYSIOLOGY STUDY AND ABLATION    . ELECTROPHYSIOLOGIC STUDY N/A 10/18/2015   Procedure: SVT Ablation;  Surgeon: Evans Lance, MD;  Location: [facility];  Service: Cardiovascular;  Laterality: N/A;  . ENDOMETRIAL BIOPSY  03/06/09   low grade squamous CIN1  . SVT ABLATION N/A 05/27/2017   Procedure: SVT Ablation;  Surgeon: Evans Lance, MD;  Location: [facility];  Service: Cardiovascular;  Laterality: N/A;    Prior to Admission medications   Medication Sig Start Date End Date Taking? Authorizing Provider  cyanocobalamin (,VITAMIN B-12,) 1000 MCG/ML injection Inject 1 mcg into the muscle every 30 (thirty) days. 09/03/15  Yes [provider]  diltiazem (CARDIZEM CD) 120 MG 24 hr capsule Take 1 capsule (120 mg total) by mouth daily. Patient taking differently: Take 120 mg by mouth at bedtime.  01/18/16  Yes Evans Lance, MD  diltiazem (CARDIZEM) 30 MG tablet Take 1 tablet (30 mg total) by mouth 3 (three) times daily as needed. 05/30/17  Yes Gollan, Kathlene November, MD  DULoxetine (CYMBALTA) 60 MG capsule Take 60 mg by mouth at bedtime.  04/09/15  Yes [provider]  ibuprofen (ADVIL,MOTRIN) 200 MG tablet Take 400 mg by mouth  every 8 (eight) hours as needed (for pain.).   Yes [provider]  pantoprazole (PROTONIX) 40 MG tablet Take 40 mg by mouth daily.   Yes [provider]  Na Sulfate-K Sulfate-Mg Sulf (SUPREP BOWEL PREP KIT) 17.5-3.13-1.6 GM/177ML SOLN Take 1 kit by mouth as directed. 09/29/18   Lucilla Lame, MD  polyethylene glycol (GOLYTELY) 236 g solution Drink one 8 oz glass every 20 mins until entire container is finished. 09/30/18   Lucilla Lame, MD    Allergies as of 09/29/2018 - Review Complete 09/29/2018  Allergen Reaction Noted  . Vancomycin Itching and Rash 05/29/2017    Family History  Problem Relation Age of Onset  . Diabetes Mother   . Hypertension Mother   . Cancer Father 20       LUNG   . CVA Father   . Cancer Maternal Grandmother        PANCREATIC  . Cancer Maternal Grandfather        LUNG  . Ovarian cancer Maternal Grandfather   . Diabetes Paternal Grandmother     Social History   Socioeconomic History  . Marital status: Married    Spouse name: Not on file  . Number of children: Not on file  . Years of education: Not on file  . Highest education level: Not on file  Occupational History  . Not on file  Social Needs  . Financial resource strain: Not on file  .  Food insecurity:    Worry: Not on file    Inability: Not on file  . Transportation needs:    Medical: Not on file    Non-medical: Not on file  Tobacco Use  . Smoking status: Current Every Day Smoker    Packs/day: 1.00    Years: 20.00    Pack years: 20.00  . Smokeless tobacco: Never Used  Substance and Sexual Activity  . Alcohol use: Yes    Comment: rare - couple x/yr  . Drug use: No  . Sexual activity: Yes    Partners: Male    Birth control/protection: None  Lifestyle  . Physical activity:    Days per week: 0 days    Minutes per session: 0 min  . Stress: Only a little  Relationships  . Social connections:    Talks on phone: Not on file    Gets together: Not on file    Attends  religious service: Not on file    Active member of club or organization: Not on file    Attends meetings of clubs or organizations: Not on file    Relationship status: Not on file  . Intimate partner violence:    Fear of current or ex partner: Not on file    Emotionally abused: Not on file    Physically abused: Not on file    Forced sexual activity: Not on file  Other Topics Concern  . Not on file  Social History Narrative  . Not on file    Review of Systems: See HPI, otherwise negative ROS  Physical Exam: LMP 09/21/2018 (Exact Date)  General:   Alert,  pleasant and cooperative in NAD Head:  Normocephalic and atraumatic. Neck:  Supple; no masses or thyromegaly. Lungs:  Clear throughout to auscultation.    Heart:  Regular rate and rhythm. Abdomen:  Soft, nontender and nondistended. Normal bowel sounds, without guarding, and without rebound.   Neurologic:  Alert and  oriented x4;  grossly normal neurologically.  Impression/Plan: Sarah Holmes is here for an endoscopy and colonoscopy to be performed for dysphagia and family history of colon polyps  Risks, benefits, limitations, and alternatives regarding  endoscopy and colonoscopy have been reviewed with the patient.  Questions have been answered.  All parties agreeable.   Lucilla Lame, MD  10/04/2018, 9:03 AM

## 2018-10-04 NOTE — Transfer of Care (Signed)
Immediate Anesthesia Transfer of Care Note  Patient: Sarah Holmes  Procedure(s) Performed: COLONOSCOPY WITH Biopsies (N/A Rectum) ESOPHAGOGASTRODUODENOSCOPY (EGD) WITH Biopsies (N/A Throat) POLYPECTOMY (N/A Rectum)  Patient Location: PACU  Anesthesia Type: General  Level of Consciousness: awake, alert  and patient cooperative  Airway and Oxygen Therapy: Patient Spontanous Breathing and Patient connected to supplemental oxygen  Post-op Assessment: Post-op Vital signs reviewed, Patient's Cardiovascular Status Stable, Respiratory Function Stable, Patent Airway and No signs of Nausea or vomiting  Post-op Vital Signs: Reviewed and stable  Complications: No apparent anesthesia complications

## 2018-10-04 NOTE — Anesthesia Procedure Notes (Signed)
Performed by: Kennet Mccort, CRNA Pre-anesthesia Checklist: Patient identified, Emergency Drugs available, Suction available, Timeout performed and Patient being monitored Patient Re-evaluated:Patient Re-evaluated prior to induction Oxygen Delivery Method: Nasal cannula Placement Confirmation: positive ETCO2       

## 2018-10-04 NOTE — Anesthesia Preprocedure Evaluation (Signed)
Anesthesia Evaluation  Patient identified by MRN, date of birth, ID band Patient awake    Reviewed: Allergy & Precautions, NPO status , Patient's Chart, lab work & pertinent test results  Airway Mallampati: II  TM Distance: >3 FB     Dental   Pulmonary Current Smoker (smoked today),    breath sounds clear to auscultation       Cardiovascular Exercise Tolerance: Good + dysrhythmias (SVT s/p cardioversions and ablation)  Rhythm:Regular Rate:Normal  HLD   Neuro/Psych Anxiety    GI/Hepatic   Endo/Other    Renal/GU      Musculoskeletal   Abdominal   Peds  Hematology   Anesthesia Other Findings psoriasis  Reproductive/Obstetrics                            Anesthesia Physical Anesthesia Plan  ASA: II  Anesthesia Plan: General   Post-op Pain Management:    Induction: Intravenous  PONV Risk Score and Plan:   Airway Management Planned: Natural Airway and Simple Face Mask  Additional Equipment:   Intra-op Plan:   Post-operative Plan:   Informed Consent: I have reviewed the patients History and Physical, chart, labs and discussed the procedure including the risks, benefits and alternatives for the proposed anesthesia with the patient or authorized representative who has indicated his/her understanding and acceptance.   Dental advisory given  Plan Discussed with: CRNA  Anesthesia Plan Comments:        Anesthesia Quick Evaluation

## 2018-10-05 ENCOUNTER — Encounter: Payer: Self-pay | Admitting: Gastroenterology

## 2018-10-06 ENCOUNTER — Encounter: Payer: Self-pay | Admitting: Gastroenterology

## 2018-10-07 ENCOUNTER — Other Ambulatory Visit: Payer: Self-pay

## 2018-10-07 ENCOUNTER — Telehealth: Payer: Self-pay

## 2018-10-07 MED ORDER — FLUCONAZOLE 100 MG PO TABS: 100.0000 mg | ORAL_TABLET | Freq: Every day | ORAL | 0 refills | Status: DC

## 2018-10-07 NOTE — Telephone Encounter (Signed)
Pt notified of EGD results. Rx sent to pt's pharmacy.

## 2018-10-07 NOTE — Telephone Encounter (Signed)
-----   Message from Lucilla Lame, MD sent at 10/06/2018  6:52 PM EDT ----- Let the patient know that her esophagus had a fungal infection.  The patient should start on Diflucan 100 mg a day for 3 weeks.

## 2018-10-08 DIAGNOSIS — H5203 Hypermetropia, bilateral: Secondary | ICD-10-CM | POA: Diagnosis not present

## 2019-02-08 DIAGNOSIS — Z79899 Other long term (current) drug therapy: Secondary | ICD-10-CM | POA: Diagnosis not present

## 2019-02-08 DIAGNOSIS — L400 Psoriasis vulgaris: Secondary | ICD-10-CM | POA: Diagnosis not present

## 2019-02-09 DIAGNOSIS — L400 Psoriasis vulgaris: Secondary | ICD-10-CM | POA: Diagnosis not present

## 2019-05-31 DIAGNOSIS — L923 Foreign body granuloma of the skin and subcutaneous tissue: Secondary | ICD-10-CM | POA: Diagnosis not present

## 2019-05-31 DIAGNOSIS — B078 Other viral warts: Secondary | ICD-10-CM | POA: Diagnosis not present

## 2019-06-21 DIAGNOSIS — B078 Other viral warts: Secondary | ICD-10-CM | POA: Diagnosis not present

## 2019-07-12 DIAGNOSIS — B078 Other viral warts: Secondary | ICD-10-CM | POA: Diagnosis not present

## 2019-08-09 DIAGNOSIS — L400 Psoriasis vulgaris: Secondary | ICD-10-CM | POA: Diagnosis not present

## 2019-08-09 DIAGNOSIS — Z79899 Other long term (current) drug therapy: Secondary | ICD-10-CM | POA: Diagnosis not present

## 2019-08-12 DIAGNOSIS — M7918 Myalgia, other site: Secondary | ICD-10-CM | POA: Diagnosis not present

## 2019-08-12 DIAGNOSIS — M542 Cervicalgia: Secondary | ICD-10-CM | POA: Diagnosis not present

## 2019-08-12 DIAGNOSIS — M9901 Segmental and somatic dysfunction of cervical region: Secondary | ICD-10-CM | POA: Diagnosis not present

## 2019-08-12 DIAGNOSIS — M5413 Radiculopathy, cervicothoracic region: Secondary | ICD-10-CM | POA: Diagnosis not present

## 2019-08-15 DIAGNOSIS — M5413 Radiculopathy, cervicothoracic region: Secondary | ICD-10-CM | POA: Diagnosis not present

## 2019-08-15 DIAGNOSIS — M9901 Segmental and somatic dysfunction of cervical region: Secondary | ICD-10-CM | POA: Diagnosis not present

## 2019-08-15 DIAGNOSIS — M542 Cervicalgia: Secondary | ICD-10-CM | POA: Diagnosis not present

## 2019-08-15 DIAGNOSIS — M7918 Myalgia, other site: Secondary | ICD-10-CM | POA: Diagnosis not present

## 2019-08-18 DIAGNOSIS — M5413 Radiculopathy, cervicothoracic region: Secondary | ICD-10-CM | POA: Diagnosis not present

## 2019-08-18 DIAGNOSIS — M7918 Myalgia, other site: Secondary | ICD-10-CM | POA: Diagnosis not present

## 2019-08-18 DIAGNOSIS — M9901 Segmental and somatic dysfunction of cervical region: Secondary | ICD-10-CM | POA: Diagnosis not present

## 2019-08-18 DIAGNOSIS — M542 Cervicalgia: Secondary | ICD-10-CM | POA: Diagnosis not present

## 2019-08-23 DIAGNOSIS — M9901 Segmental and somatic dysfunction of cervical region: Secondary | ICD-10-CM | POA: Diagnosis not present

## 2019-08-23 DIAGNOSIS — M5413 Radiculopathy, cervicothoracic region: Secondary | ICD-10-CM | POA: Diagnosis not present

## 2019-08-23 DIAGNOSIS — M542 Cervicalgia: Secondary | ICD-10-CM | POA: Diagnosis not present

## 2019-08-23 DIAGNOSIS — M7918 Myalgia, other site: Secondary | ICD-10-CM | POA: Diagnosis not present

## 2019-08-24 DIAGNOSIS — M9901 Segmental and somatic dysfunction of cervical region: Secondary | ICD-10-CM | POA: Diagnosis not present

## 2019-08-24 DIAGNOSIS — M7918 Myalgia, other site: Secondary | ICD-10-CM | POA: Diagnosis not present

## 2019-08-24 DIAGNOSIS — M5413 Radiculopathy, cervicothoracic region: Secondary | ICD-10-CM | POA: Diagnosis not present

## 2019-08-24 DIAGNOSIS — M542 Cervicalgia: Secondary | ICD-10-CM | POA: Diagnosis not present

## 2019-08-25 DIAGNOSIS — M9901 Segmental and somatic dysfunction of cervical region: Secondary | ICD-10-CM | POA: Diagnosis not present

## 2019-08-25 DIAGNOSIS — M5413 Radiculopathy, cervicothoracic region: Secondary | ICD-10-CM | POA: Diagnosis not present

## 2019-08-25 DIAGNOSIS — M7918 Myalgia, other site: Secondary | ICD-10-CM | POA: Diagnosis not present

## 2019-08-25 DIAGNOSIS — M542 Cervicalgia: Secondary | ICD-10-CM | POA: Diagnosis not present

## 2019-08-26 DIAGNOSIS — M7918 Myalgia, other site: Secondary | ICD-10-CM | POA: Diagnosis not present

## 2019-08-26 DIAGNOSIS — M542 Cervicalgia: Secondary | ICD-10-CM | POA: Diagnosis not present

## 2019-08-26 DIAGNOSIS — M5413 Radiculopathy, cervicothoracic region: Secondary | ICD-10-CM | POA: Diagnosis not present

## 2019-08-26 DIAGNOSIS — M9901 Segmental and somatic dysfunction of cervical region: Secondary | ICD-10-CM | POA: Diagnosis not present

## 2019-08-29 DIAGNOSIS — M542 Cervicalgia: Secondary | ICD-10-CM | POA: Diagnosis not present

## 2019-08-29 DIAGNOSIS — M7918 Myalgia, other site: Secondary | ICD-10-CM | POA: Diagnosis not present

## 2019-08-29 DIAGNOSIS — M5413 Radiculopathy, cervicothoracic region: Secondary | ICD-10-CM | POA: Diagnosis not present

## 2019-08-29 DIAGNOSIS — M9901 Segmental and somatic dysfunction of cervical region: Secondary | ICD-10-CM | POA: Diagnosis not present

## 2019-08-30 DIAGNOSIS — M7918 Myalgia, other site: Secondary | ICD-10-CM | POA: Diagnosis not present

## 2019-08-30 DIAGNOSIS — M9901 Segmental and somatic dysfunction of cervical region: Secondary | ICD-10-CM | POA: Diagnosis not present

## 2019-08-30 DIAGNOSIS — M542 Cervicalgia: Secondary | ICD-10-CM | POA: Diagnosis not present

## 2019-08-30 DIAGNOSIS — M5413 Radiculopathy, cervicothoracic region: Secondary | ICD-10-CM | POA: Diagnosis not present

## 2019-09-02 DIAGNOSIS — M5413 Radiculopathy, cervicothoracic region: Secondary | ICD-10-CM | POA: Diagnosis not present

## 2019-09-02 DIAGNOSIS — M542 Cervicalgia: Secondary | ICD-10-CM | POA: Diagnosis not present

## 2019-09-02 DIAGNOSIS — M7918 Myalgia, other site: Secondary | ICD-10-CM | POA: Diagnosis not present

## 2019-09-02 DIAGNOSIS — M9901 Segmental and somatic dysfunction of cervical region: Secondary | ICD-10-CM | POA: Diagnosis not present

## 2020-05-28 ENCOUNTER — Other Ambulatory Visit: Payer: Self-pay | Admitting: *Deleted

## 2020-05-28 DIAGNOSIS — R32 Unspecified urinary incontinence: Secondary | ICD-10-CM

## 2020-05-29 ENCOUNTER — Encounter: Payer: Self-pay | Admitting: Urology

## 2020-05-29 ENCOUNTER — Other Ambulatory Visit
Admission: RE | Admit: 2020-05-29 | Discharge: 2020-05-29 | Disposition: A | Discharge status: Home / Self Care | Payer: BC Managed Care – PPO | Attending: Urology | Admitting: Urology

## 2020-05-29 ENCOUNTER — Other Ambulatory Visit: Payer: Self-pay

## 2020-05-29 ENCOUNTER — Ambulatory Visit (INDEPENDENT_AMBULATORY_CARE_PROVIDER_SITE_OTHER): Payer: BC Managed Care – PPO | Admitting: Urology

## 2020-05-29 VITALS — BP 137/86 | HR 74 | Ht 65.5 in | Wt 157.0 lb

## 2020-05-29 DIAGNOSIS — N3281 Overactive bladder: Secondary | ICD-10-CM

## 2020-05-29 DIAGNOSIS — R32 Unspecified urinary incontinence: Secondary | ICD-10-CM | POA: Insufficient documentation

## 2020-05-29 LAB — URINALYSIS, COMPLETE (UACMP) WITH MICROSCOPIC
Bacteria, UA: NONE SEEN
Bilirubin Urine: NEGATIVE
Glucose, UA: NEGATIVE mg/dL
Hgb urine dipstick: NEGATIVE
Ketones, ur: NEGATIVE mg/dL
Leukocytes,Ua: NEGATIVE
Nitrite: NEGATIVE
Protein, ur: NEGATIVE mg/dL
RBC / HPF: NONE SEEN RBC/hpf (ref 0–5)
Specific Gravity, Urine: 1.015 (ref 1.005–1.030)
pH: 6.5 (ref 5.0–8.0)

## 2020-05-29 LAB — BLADDER SCAN AMB NON-IMAGING: Scan Result: 89

## 2020-05-29 MED ORDER — OXYBUTYNIN CHLORIDE ER 15 MG PO TB24: 15.0000 mg | ORAL_TABLET | Freq: Every day | ORAL | 11 refills | Status: AC

## 2020-05-29 NOTE — Patient Instructions (Signed)
1. Avoid diet drinks and sodas as these can irritate the bladder 2. Try to urinate every 2 hours during the day, minimize fluids after 6pm, and urinate twice before bed   Overactive Bladder, Adult  Overactive bladder refers to a condition in which a person has a sudden need to pass urine. The person may leak urine if he or she cannot get to the bathroom fast enough (urinary incontinence). A person with this condition may also wake up several times in the night to go to the bathroom. Overactive bladder is associated with poor nerve signals between your bladder and your brain. Your bladder may get the signal to empty before it is full. You may also have very sensitive muscles that make your bladder squeeze too soon. These symptoms might interfere with daily work or social activities. What are the causes? This condition may be associated with or caused by:  Urinary tract infection.  Infection of nearby tissues, such as the prostate.  Prostate enlargement.  Surgery on the uterus or urethra.  Bladder stones, inflammation, or tumors.  Drinking too much caffeine or alcohol.  Certain medicines, especially medicines that get rid of extra fluid in the body (diuretics).  Muscle or nerve weakness, especially from: ? A spinal cord injury. ? Stroke. ? Multiple sclerosis. ? Parkinson's disease.  Diabetes.  Constipation. What increases the risk? You may be at greater risk for overactive bladder if you:  Are an older adult.  Smoke.  Are going through menopause.  Have prostate problems.  Have a neurological disease, such as stroke, dementia, Parkinson's disease, or multiple sclerosis (MS).  Eat or drink things that irritate the bladder. These include alcohol, spicy food, and caffeine.  Are overweight or obese. What are the signs or symptoms? Symptoms of this condition include:  Sudden, strong urge to urinate.  Leaking urine.  Urinating 8 or more times a day.  Waking up to  urinate 2 or more times a night. How is this diagnosed? Your health care provider may suspect overactive bladder based on your symptoms. He or she will diagnose this condition by:  A physical exam and medical history.  Blood or urine tests. You might need bladder or urine tests to help determine what is causing your overactive bladder. You might also need to see a health care provider who specializes in urinary tract problems (urologist). How is this treated? Treatment for overactive bladder depends on the cause of your condition and whether it is mild or severe. You can also make lifestyle changes at home. Options include:  Bladder training. This may include: ? Learning to control the urge to urinate by following a schedule that directs you to urinate at regular intervals (timed voiding). ? Doing Kegel exercises to strengthen your pelvic floor muscles, which support your bladder. Toning these muscles can help you control urination, even if your bladder muscles are overactive.  Special devices. This may include: ? Biofeedback, which uses sensors to help you become aware of your body's signals. ? Electrical stimulation, which uses electrodes placed inside the body (implanted) or outside the body. These electrodes send gentle pulses of electricity to strengthen the nerves or muscles that control the bladder. ? Women may use a plastic device that fits into the vagina and supports the bladder (pessary).  Medicines. ? Antibiotics to treat bladder infection. ? Antispasmodics to stop the bladder from releasing urine at the wrong time. ? Tricyclic antidepressants to relax bladder muscles. ? Injections of botulinum toxin type A directly into  the bladder tissue to relax bladder muscles.  Lifestyle changes. This may include: ? Weight loss. Talk to your health care provider about weight loss methods that would work best for you. ? Diet changes. This may include reducing how much alcohol and caffeine  you consume, or drinking fluids at different times of the day. ? Not smoking. Do not use any products that contain nicotine or tobacco, such as cigarettes and e-cigarettes. If you need help quitting, ask your health care provider.  Surgery. ? A device may be implanted to help manage the nerve signals that control urination. ? An electrode may be implanted to stimulate electrical signals in the bladder. ? A procedure may be done to change the shape of the bladder. This is done only in very severe cases. Follow these instructions at home: Lifestyle  Make any diet or lifestyle changes that are recommended by your health care provider. These may include: ? Drinking less fluid or drinking fluids at different times of the day. ? Cutting down on caffeine or alcohol. ? Doing Kegel exercises. ? Losing weight if needed. ? Eating a healthy and balanced diet to prevent constipation. This may include:  Eating foods that are high in fiber, such as fresh fruits and vegetables, whole grains, and beans.  Limiting foods that are high in fat and processed sugars, such as fried and sweet foods. General instructions  Take over-the-counter and prescription medicines only as told by your health care provider.  If you were prescribed an antibiotic medicine, take it as told by your health care provider. Do not stop taking the antibiotic even if you start to feel better.  Use any implants or pessary as told by your health care provider.  If needed, wear pads to absorb urine leakage.  Keep a journal or log to track how much and when you drink and when you feel the need to urinate. This will help your health care provider monitor your condition.  Keep all follow-up visits as told by your health care provider. This is important. Contact a health care provider if:  You have a fever.  Your symptoms do not get better with treatment.  Your pain and discomfort get worse.  You have more frequent urges to  urinate. Get help right away if:  You are not able to control your bladder. Summary  Overactive bladder refers to a condition in which a person has a sudden need to pass urine.  Several conditions may lead to an overactive bladder.  Treatment for overactive bladder depends on the cause and severity of your condition.  Follow your health care provider's instructions about lifestyle changes, doing Kegel exercises, keeping a journal, and taking medicines. This information is not intended to replace advice given to you by your health care provider. Make sure you discuss any questions you have with your health care provider. Document Revised: 03/24/2019 Document Reviewed: 12/17/2017 Elsevier Patient Education  [place].

## 2020-05-29 NOTE — Progress Notes (Signed)
05/29/20 2:00 PM   CRYSTALYNN MCINERNEY [birth year] [id number]  CC: Urge incontinence  HPI: I saw Ms. Kosch in urology clinic today for evaluation of urge incontinence.  She is a 49 year old female with no prior pregnancies who reports multiyear history of bothersome urgency, frequency, nocturia 2-3 times per night, and urge incontinence.  Her primary issue is severe urgency with leakage of large volumes of urine.  She drinks water and diet sodas during the day.  She is a smoker.  She denies any prior urinary tract infections.  She recently tried oxybutynin 5 mg XL with no to minimal improvement in her urinary symptoms.  She denies any gross hematuria or dysuria.  Urinalysis is benign today, and PVR is normal at 89 mL.    PMH: Past Medical History:  Diagnosis Date  . Abnormal Pap smear 08/28/09   lgsil  . Abnormal Pap smear 01/29/09   lgsil  . Abnormal uterine bleeding    CONTROLLED W/DEPO PROVERA  . Anxiety   . High triglycerides   . Psoriasis   . Sleep difficulties   . SVT (supraventricular tachycardia) [facility])     Surgical History: Past Surgical History:  Procedure Laterality Date  . BREAST ENHANCEMENT SURGERY  1998   Dr. Wendy Poet  . CARDIAC ELECTROPHYSIOLOGY STUDY AND ABLATION    . COLONOSCOPY WITH PROPOFOL N/A 10/04/2018   Procedure: COLONOSCOPY WITH Biopsies;  Surgeon: Lucilla Lame, MD;  Location: [place] [facility];  Service: Endoscopy;  Laterality: N/A;  . ELECTROPHYSIOLOGIC STUDY N/A 10/18/2015   Procedure: SVT Ablation;  Surgeon: Evans Lance, MD;  Location: [facility];  Service: Cardiovascular;  Laterality: N/A;  . ENDOMETRIAL BIOPSY  03/06/09   low grade squamous CIN1  . ESOPHAGOGASTRODUODENOSCOPY (EGD) WITH PROPOFOL N/A 10/04/2018   Procedure: ESOPHAGOGASTRODUODENOSCOPY (EGD) WITH Biopsies;  Surgeon: Lucilla Lame, MD;  Location: [facility];  Service: Endoscopy;  Laterality: N/A;  . POLYPECTOMY N/A 10/04/2018   Procedure: POLYPECTOMY;  Surgeon:  Lucilla Lame, MD;  Location: [facility];  Service: Endoscopy;  Laterality: N/A;  . SVT ABLATION N/A 05/27/2017   Procedure: SVT Ablation;  Surgeon: Evans Lance, MD;  Location: [place] [facility];  Service: Cardiovascular;  Laterality: N/A;    Family History: Family History  Problem Relation Age of Onset  . Diabetes Mother   . Hypertension Mother   . Cancer Father 75       LUNG   . CVA Father   . Cancer Maternal Grandmother        PANCREATIC  . Cancer Maternal Grandfather        LUNG  . Ovarian cancer Maternal Grandfather   . Diabetes Paternal Grandmother     Social History:  reports that she has been smoking. She has a 20.00 pack-year smoking history. She has never used smokeless tobacco. She reports current alcohol use. She reports that she does not use drugs.  Physical Exam: BP 137/86   Pulse 74   Ht 5' 5.5" (1.664 m)   Wt 157 lb (71.2 kg)   BMI 25.73 kg/m    Constitutional:  Alert and oriented, No acute distress. Cardiovascular: No clubbing, cyanosis, or edema. Respiratory: Normal respiratory effort, no increased work of breathing. GI: Abdomen is soft, nontender, nondistended, no abdominal masses  Laboratory Data: Reviewed, see HPI   Assessment & Plan:   In summary, she is a 49 year old healthy female with moderate to severe OAB symptoms of urgency and urge incontinence.  Urinalysis is benign and  PVR is normal.  We discussed that overactive bladder (OAB) is not a disease, but is a symptom complex that is generally not life-threatening.  Symptoms typically include urinary urgency, frequency, and urge incontinence.  There are numerous treatment options, however there are risks and benefits with both medical and surgical management.  First-line treatment is behavioral therapies including bladder training, pelvic floor muscle training, and fluid management.  Second line treatments include oral antimuscarinics(Ditropan er, Trospium) and beta-3 agonist (Mybetriq).  There is typically a period of medication trial (4-8 weeks) to find the optimal therapy and dosing. If symptoms are bothersome despite the above management, third line options include intra-detrusor botox, peripheral tibial nerve stimulation (PTNS), and interstim (SNS). These are more invasive treatments with higher side effect profile, but may improve quality of life for patients with severe OAB symptoms.   Trial of oxybutynin 15mg  XL Behavioral strategies discussed at length Referral to pelvic floor PT RTC 8 weeks symptom check-consider PTNS, Botox, or InterStim in the future   Nickolas Madrid, MD 05/29/2020  [facility] [address] [place], [place] [address] [phone]

## 2020-07-16 ENCOUNTER — Other Ambulatory Visit: Payer: Self-pay | Admitting: *Deleted

## 2020-07-16 DIAGNOSIS — R32 Unspecified urinary incontinence: Secondary | ICD-10-CM

## 2020-07-17 ENCOUNTER — Ambulatory Visit: Payer: BC Managed Care – PPO | Admitting: Urology

## 2021-05-18 ENCOUNTER — Other Ambulatory Visit: Payer: Self-pay | Admitting: Urology

## 2022-02-24 ENCOUNTER — Other Ambulatory Visit: Payer: Self-pay | Admitting: Internal Medicine

## 2022-02-24 DIAGNOSIS — F17210 Nicotine dependence, cigarettes, uncomplicated: Secondary | ICD-10-CM

## 2022-03-07 ENCOUNTER — Ambulatory Visit
Admission: RE | Admit: 2022-03-07 | Discharge: 2022-03-07 | Disposition: A | Discharge status: Home / Self Care | Payer: BC Managed Care – PPO | Source: Ambulatory Visit | Attending: Internal Medicine | Admitting: Internal Medicine

## 2022-03-07 DIAGNOSIS — F17210 Nicotine dependence, cigarettes, uncomplicated: Secondary | ICD-10-CM

## 2022-03-11 ENCOUNTER — Other Ambulatory Visit: Payer: Self-pay | Admitting: Internal Medicine

## 2022-03-11 DIAGNOSIS — R918 Other nonspecific abnormal finding of lung field: Secondary | ICD-10-CM

## 2022-03-11 DIAGNOSIS — R9389 Abnormal findings on diagnostic imaging of other specified body structures: Secondary | ICD-10-CM

## 2022-06-10 ENCOUNTER — Other Ambulatory Visit: Payer: Self-pay | Admitting: Internal Medicine

## 2022-06-10 DIAGNOSIS — R9389 Abnormal findings on diagnostic imaging of other specified body structures: Secondary | ICD-10-CM

## 2022-06-12 ENCOUNTER — Ambulatory Visit: Payer: BC Managed Care – PPO

## 2022-06-25 ENCOUNTER — Ambulatory Visit
Admission: RE | Admit: 2022-06-25 | Discharge: 2022-06-25 | Disposition: A | Discharge status: Home / Self Care | Payer: BC Managed Care – PPO | Source: Ambulatory Visit | Attending: Internal Medicine | Admitting: Internal Medicine

## 2022-06-25 DIAGNOSIS — R9389 Abnormal findings on diagnostic imaging of other specified body structures: Secondary | ICD-10-CM

## 2022-07-14 ENCOUNTER — Other Ambulatory Visit: Payer: BC Managed Care – PPO

## 2022-09-10 IMAGING — CT CT CHEST LUNG CANCER SCREENING LOW DOSE W/O CM
1 of 2 series · 14 of 33 positions shown, 18 images · non-contrast
Comparison: None.

CLINICAL DATA: 50-year-old asymptomatic female current smoker with
25 pack-year smoking history.



[Series 4: chest 1mm/super d · axial · 0.67mm/px · z∈[-289,-28]mm · 14 of 359 slices shown, 18 images]
[im 16/359  mediastinal]
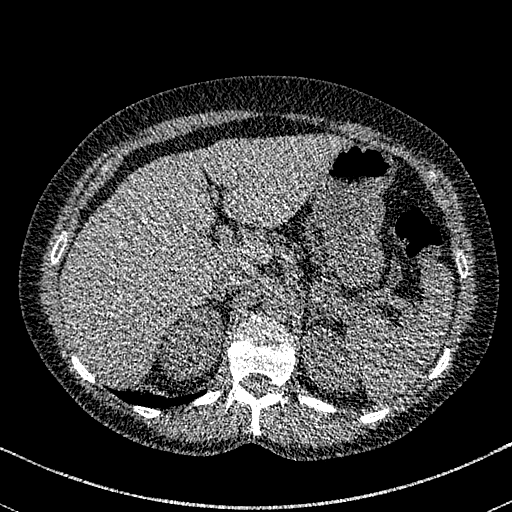
[im 16/359  lung]
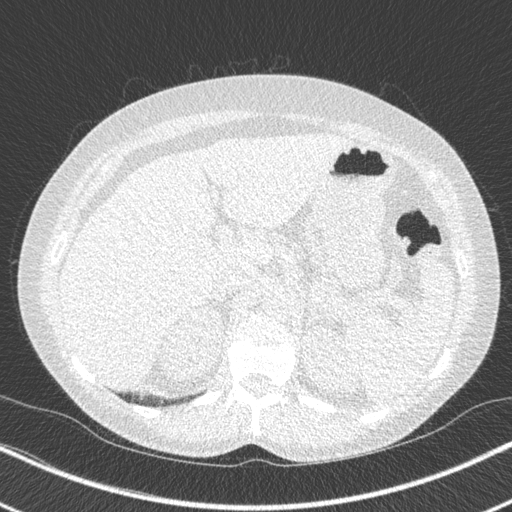
[im 47/359  lung]
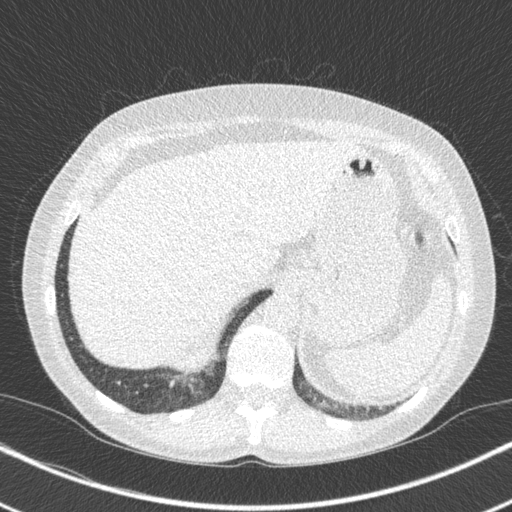
[im 78/359  lung]
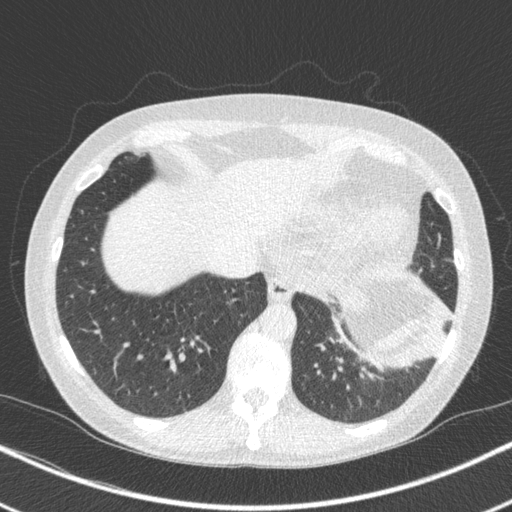
[im 94/359  lung]
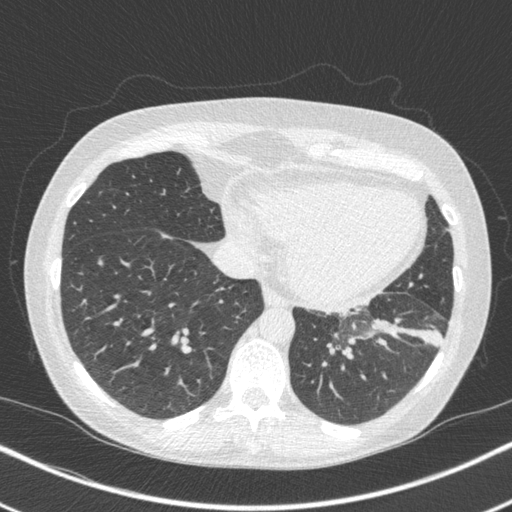
[im 125/359  mediastinal]
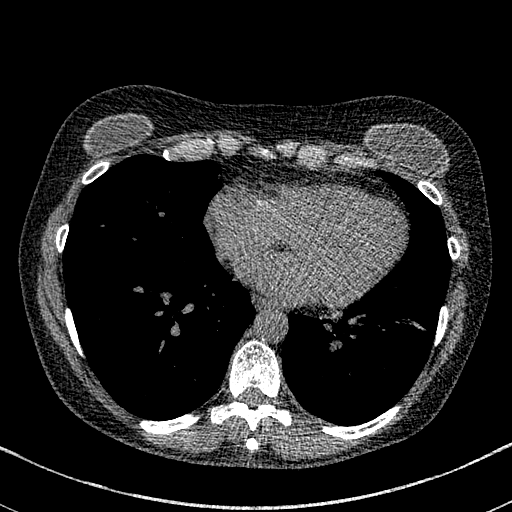
[im 125/359  lung]
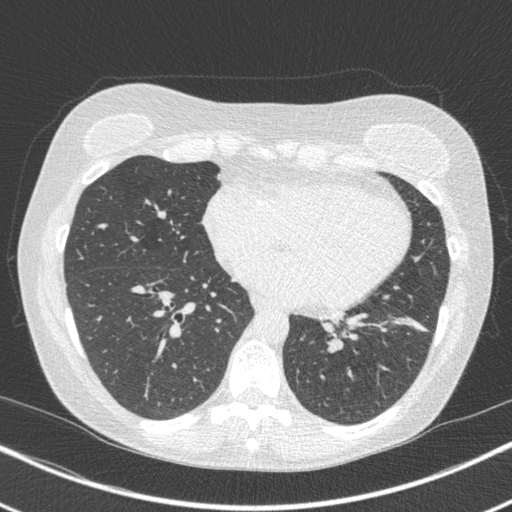
[im 156/359  lung]
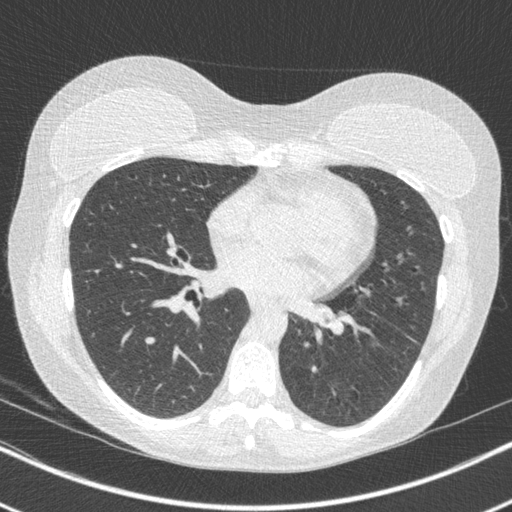
[im 172/359  lung]
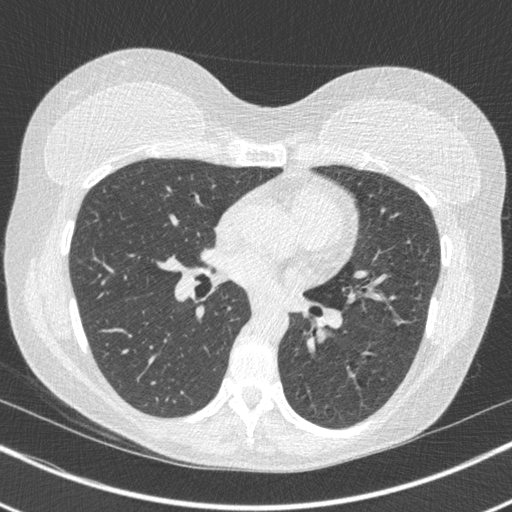
[im 180/359  lung]
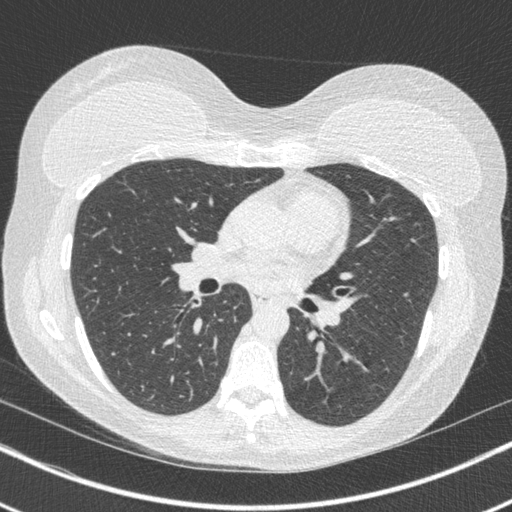
[im 203/359  mediastinal]
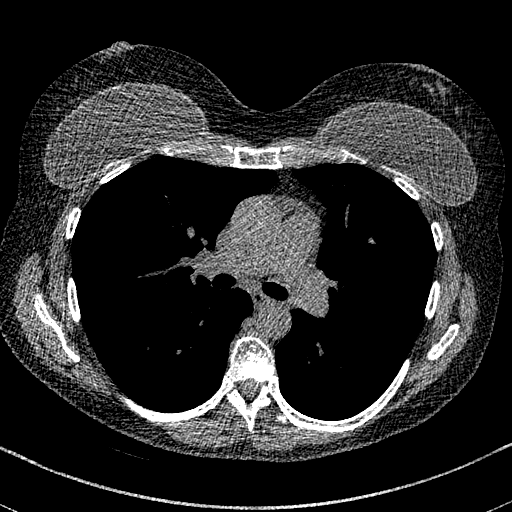
[im 203/359  lung]
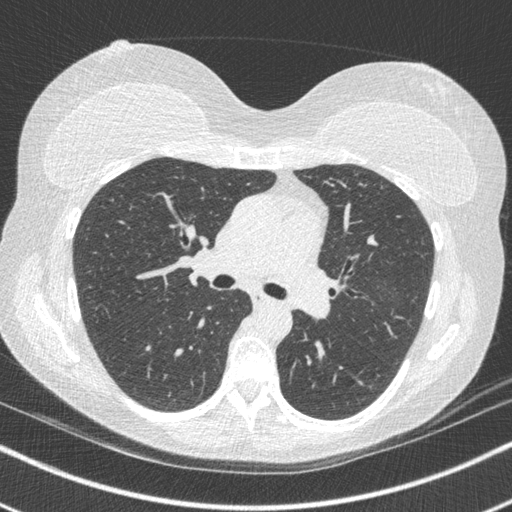
[im 234/359  lung]
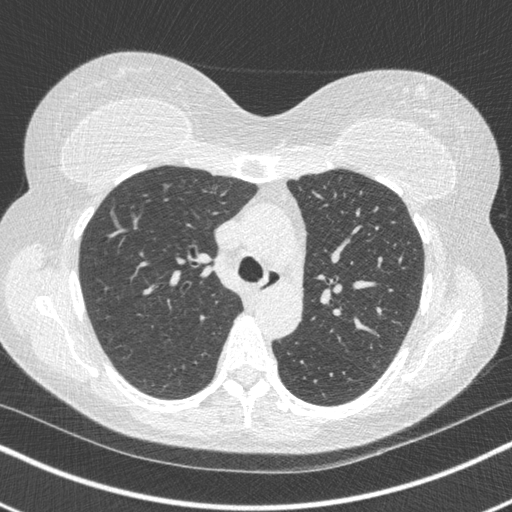
[im 265/359  lung]
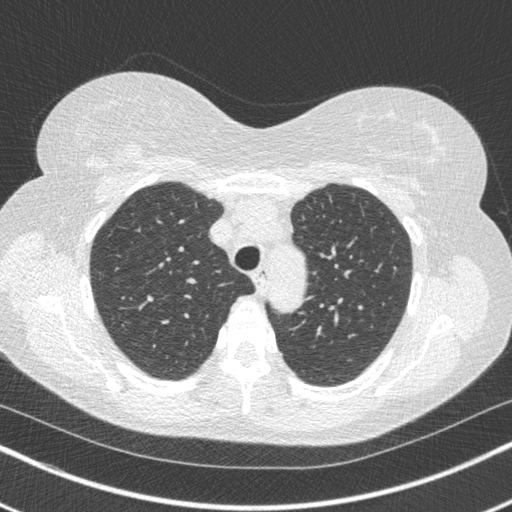
[im 281/359  lung]
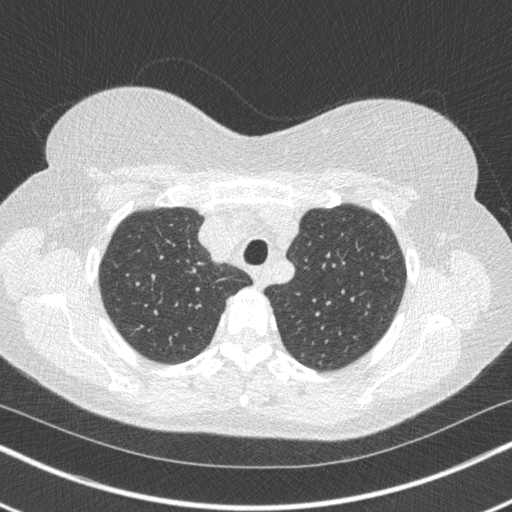
[im 312/359  mediastinal]
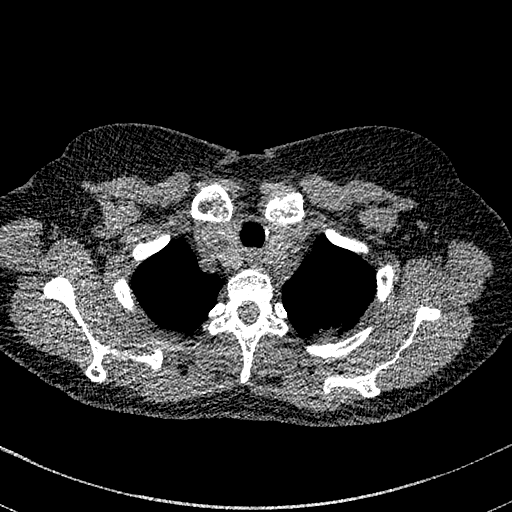
[im 312/359  lung]
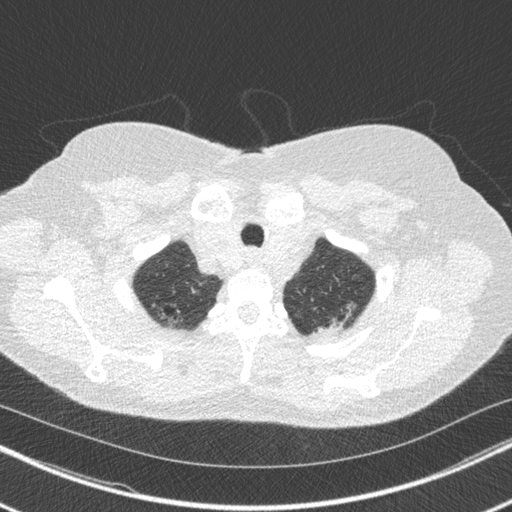
[im 343/359  lung]
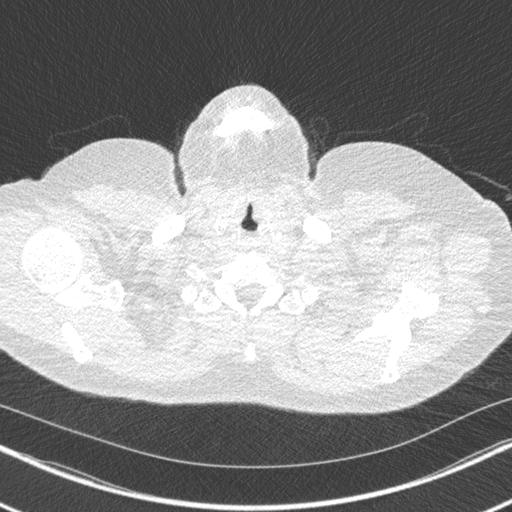

[14 of 33 positions shown; findings below may reference images not displayed]

FINDINGS: Cardiovascular: Normal heart size. Trace pericardial
effusion/thickening. Atherosclerotic nonaneurysmal thoracic aorta.
Normal caliber pulmonary arteries.

Mediastinum/Nodes: Subcentimeter hypodense right thyroid nodule. Not
clinically significant; no follow-up imaging recommended (ref: [HOSPITAL]. [DATE]): 143-50). Unremarkable esophagus. No
pathologically enlarged axillary, mediastinal or hilar lymph nodes,
noting limited sensitivity for the detection of hilar adenopathy on
this noncontrast study.

Lungs/Pleura: No pneumothorax. No pleural effusion. Mild
centrilobular emphysema with diffuse bronchial wall thickening.
Focal occlusion of a segmental left lower lobe bronchus (series
3/image 167) with bandlike postobstructive scarring versus
atelectasis in the anterior peripheral left lower lobe. Otherwise no
significant pulmonary nodules.

Upper abdomen: No acute abnormality.

Musculoskeletal: No aggressive appearing focal osseous lesions.
Mild-to-moderate thoracic spondylosis. Bilateral breast prostheses
noted.
IMPRESSION: 1. Lung-RADS 4A, suspicious. Follow up low-dose chest CT without
contrast in 3 months (please use the following order, "CT CHEST LCS
NODULE FOLLOW-UP W/O CM") is recommended. Nonspecific focal
occlusion of a segmental left lower lobe bronchus with associated
bandlike postobstructive scarring versus atelectasis. Finding could
be due to mucous plug versus underlying endobronchial lesion.
2. Mild centrilobular emphysema with diffuse bronchial wall
thickening, suggesting COPD.
3. Trace pericardial effusion/thickening.
4. Aortic Atherosclerosis ([id number].X) and Emphysema ([id number].Z).

These results will be called to the ordering clinician or
representative by the Radiologist Assistant, and communication
documented in the PACS or [REDACTED].

## 2023-08-07 ENCOUNTER — Other Ambulatory Visit: Payer: Self-pay | Admitting: Internal Medicine

## 2023-08-07 DIAGNOSIS — F17210 Nicotine dependence, cigarettes, uncomplicated: Secondary | ICD-10-CM

## 2023-08-07 DIAGNOSIS — I471 Supraventricular tachycardia, unspecified: Secondary | ICD-10-CM

## 2023-08-14 ENCOUNTER — Ambulatory Visit
Admission: RE | Admit: 2023-08-14 | Discharge: 2023-08-14 | Disposition: A | Discharge status: Home / Self Care | Payer: BC Managed Care – PPO | Source: Ambulatory Visit | Attending: Internal Medicine | Admitting: Internal Medicine

## 2023-08-14 DIAGNOSIS — I471 Supraventricular tachycardia, unspecified: Secondary | ICD-10-CM | POA: Diagnosis not present

## 2023-08-14 DIAGNOSIS — F17210 Nicotine dependence, cigarettes, uncomplicated: Secondary | ICD-10-CM | POA: Insufficient documentation

## 2023-09-24 ENCOUNTER — Other Ambulatory Visit: Payer: Self-pay | Admitting: Internal Medicine

## 2023-09-24 DIAGNOSIS — J849 Interstitial pulmonary disease, unspecified: Secondary | ICD-10-CM

## 2023-10-01 ENCOUNTER — Ambulatory Visit
Admission: RE | Admit: 2023-10-01 | Discharge: 2023-10-01 | Disposition: A | Discharge status: Home / Self Care | Payer: BC Managed Care – PPO | Source: Ambulatory Visit | Attending: Internal Medicine | Admitting: Internal Medicine

## 2023-10-01 DIAGNOSIS — J849 Interstitial pulmonary disease, unspecified: Secondary | ICD-10-CM | POA: Insufficient documentation

## 2023-10-01 MED ORDER — IOHEXOL 300 MG/ML  SOLN
75.0000 mL | Freq: Once | INTRAMUSCULAR | Status: AC | PRN
  Administered 2023-10-01: 75 mL via INTRAVENOUS

## 2024-12-23 ENCOUNTER — Encounter

## 2024-12-23 DIAGNOSIS — Z860100 Personal history of colon polyps, unspecified: Secondary | ICD-10-CM | POA: Diagnosis not present

## 2024-12-23 DIAGNOSIS — Z09 Encounter for follow-up examination after completed treatment for conditions other than malignant neoplasm: Secondary | ICD-10-CM | POA: Diagnosis present

## 2024-12-23 DIAGNOSIS — K5730 Diverticulosis of large intestine without perforation or abscess without bleeding: Secondary | ICD-10-CM | POA: Diagnosis not present
